# Patient Record
Sex: Female | Born: 1945 | Race: Black or African American | Hispanic: No | Smoking: Former smoker
Health system: Southern US, Community
[De-identification: ages and names within clinical notes are randomized; demographics above are authoritative.]

## PROBLEM LIST (undated history)

## (undated) DIAGNOSIS — E7800 Pure hypercholesterolemia, unspecified: Secondary | ICD-10-CM

## (undated) DIAGNOSIS — K635 Polyp of colon: Secondary | ICD-10-CM

## (undated) DIAGNOSIS — I509 Heart failure, unspecified: Secondary | ICD-10-CM

## (undated) DIAGNOSIS — E119 Type 2 diabetes mellitus without complications: Secondary | ICD-10-CM

## (undated) DIAGNOSIS — I429 Cardiomyopathy, unspecified: Secondary | ICD-10-CM

## (undated) DIAGNOSIS — K5790 Diverticulosis of intestine, part unspecified, without perforation or abscess without bleeding: Secondary | ICD-10-CM

## (undated) DIAGNOSIS — I10 Essential (primary) hypertension: Secondary | ICD-10-CM

## (undated) DIAGNOSIS — I2510 Atherosclerotic heart disease of native coronary artery without angina pectoris: Secondary | ICD-10-CM

## (undated) HISTORY — PX: ABDOMINAL HYSTERECTOMY: SHX81

## (undated) HISTORY — PX: COLONOSCOPY: SHX174

---

## 2011-01-23 ENCOUNTER — Ambulatory Visit: Payer: Self-pay | Admitting: Internal Medicine

## 2011-03-06 ENCOUNTER — Ambulatory Visit: Payer: Self-pay | Admitting: Internal Medicine

## 2012-03-19 ENCOUNTER — Ambulatory Visit: Payer: Self-pay

## 2012-04-24 ENCOUNTER — Ambulatory Visit: Payer: Self-pay

## 2012-05-21 ENCOUNTER — Ambulatory Visit: Payer: Self-pay

## 2012-07-10 ENCOUNTER — Ambulatory Visit: Payer: Self-pay

## 2013-03-23 ENCOUNTER — Ambulatory Visit: Payer: Self-pay

## 2013-03-27 ENCOUNTER — Ambulatory Visit: Payer: Self-pay

## 2013-08-25 ENCOUNTER — Inpatient Hospital Stay: Payer: Self-pay | Admitting: Internal Medicine

## 2013-08-25 LAB — CBC
HCT: 47.9 % — AB (ref 35.0–47.0)
HGB: 15.6 g/dL (ref 12.0–16.0)
MCH: 27.8 pg (ref 26.0–34.0)
MCHC: 32.5 g/dL (ref 32.0–36.0)
MCV: 86 fL (ref 80–100)
PLATELETS: 280 10*3/uL (ref 150–440)
RBC: 5.6 10*6/uL — AB (ref 3.80–5.20)
RDW: 15.3 % — AB (ref 11.5–14.5)
WBC: 6.9 10*3/uL (ref 3.6–11.0)

## 2013-08-25 LAB — TROPONIN I
TROPONIN-I: 0.07 ng/mL — AB
Troponin-I: <0.02 ng/mL
Troponin-I: 0.05 ng/mL

## 2013-08-25 LAB — COMPREHENSIVE METABOLIC PANEL
ALBUMIN: 3.7 g/dL (ref 3.4–5.0)
AST: 14 U/L — AB (ref 15–37)
Alkaline Phosphatase: 91 U/L
Anion Gap: 6 — ABNORMAL LOW (ref 7–16)
BUN: 13 mg/dL (ref 7–18)
Bilirubin,Total: 0.4 mg/dL (ref 0.2–1.0)
Calcium, Total: 9.3 mg/dL (ref 8.5–10.1)
Chloride: 105 mmol/L (ref 98–107)
Co2: 28 mmol/L (ref 21–32)
Creatinine: 1.27 mg/dL (ref 0.60–1.30)
EGFR (African American): 50 — ABNORMAL LOW
GFR CALC NON AF AMER: 43 — AB
Glucose: 169 mg/dL — ABNORMAL HIGH (ref 65–99)
OSMOLALITY: 282 (ref 275–301)
POTASSIUM: 4.1 mmol/L (ref 3.5–5.1)
SGPT (ALT): 25 U/L (ref 12–78)
SODIUM: 139 mmol/L (ref 136–145)
Total Protein: 8 g/dL (ref 6.4–8.2)

## 2013-08-25 LAB — PRO B NATRIURETIC PEPTIDE: B-Type Natriuretic Peptide: 967 pg/mL — ABNORMAL HIGH (ref 0–125)

## 2013-08-26 LAB — BASIC METABOLIC PANEL
ANION GAP: 5 — AB (ref 7–16)
BUN: 18 mg/dL (ref 7–18)
CREATININE: 1.5 mg/dL — AB (ref 0.60–1.30)
Calcium, Total: 8.8 mg/dL (ref 8.5–10.1)
Chloride: 108 mmol/L — ABNORMAL HIGH (ref 98–107)
Co2: 29 mmol/L (ref 21–32)
EGFR (African American): 41 — ABNORMAL LOW
EGFR (Non-African Amer.): 35 — ABNORMAL LOW
Glucose: 114 mg/dL — ABNORMAL HIGH (ref 65–99)
OSMOLALITY: 286 (ref 275–301)
Potassium: 3.7 mmol/L (ref 3.5–5.1)
Sodium: 142 mmol/L (ref 136–145)

## 2013-08-26 LAB — CBC WITH DIFFERENTIAL/PLATELET
Basophil #: 0 10*3/uL (ref 0.0–0.1)
Basophil %: 0.5 %
Eosinophil #: 0.1 10*3/uL (ref 0.0–0.7)
Eosinophil %: 0.7 %
HCT: 40 % (ref 35.0–47.0)
HGB: 13.3 g/dL (ref 12.0–16.0)
LYMPHS ABS: 2.2 10*3/uL (ref 1.0–3.6)
Lymphocyte %: 27.2 %
MCH: 28.4 pg (ref 26.0–34.0)
MCHC: 33.2 g/dL (ref 32.0–36.0)
MCV: 85 fL (ref 80–100)
MONO ABS: 0.6 x10 3/mm (ref 0.2–0.9)
Monocyte %: 7.1 %
NEUTROS ABS: 5.2 10*3/uL (ref 1.4–6.5)
NEUTROS PCT: 64.5 %
Platelet: 244 10*3/uL (ref 150–440)
RBC: 4.68 10*6/uL (ref 3.80–5.20)
RDW: 14.9 % — ABNORMAL HIGH (ref 11.5–14.5)
WBC: 8 10*3/uL (ref 3.6–11.0)

## 2013-08-26 LAB — PROTIME-INR
INR: 1.1
Prothrombin Time: 14 secs (ref 11.5–14.7)

## 2013-08-27 LAB — CBC WITH DIFFERENTIAL/PLATELET
BASOS ABS: 0 10*3/uL (ref 0.0–0.1)
BASOS PCT: 0.5 %
Eosinophil #: 0.1 10*3/uL (ref 0.0–0.7)
Eosinophil %: 1.5 %
HCT: 38.4 % (ref 35.0–47.0)
HGB: 12.6 g/dL (ref 12.0–16.0)
LYMPHS PCT: 35 %
Lymphocyte #: 2.2 10*3/uL (ref 1.0–3.6)
MCH: 27.9 pg (ref 26.0–34.0)
MCHC: 32.8 g/dL (ref 32.0–36.0)
MCV: 85 fL (ref 80–100)
MONO ABS: 0.5 x10 3/mm (ref 0.2–0.9)
Monocyte %: 8 %
Neutrophil #: 3.4 10*3/uL (ref 1.4–6.5)
Neutrophil %: 55 %
Platelet: 228 10*3/uL (ref 150–440)
RBC: 4.52 10*6/uL (ref 3.80–5.20)
RDW: 15.2 % — ABNORMAL HIGH (ref 11.5–14.5)
WBC: 6.2 10*3/uL (ref 3.6–11.0)

## 2013-08-27 LAB — COMPREHENSIVE METABOLIC PANEL
ALBUMIN: 3 g/dL — AB (ref 3.4–5.0)
Alkaline Phosphatase: 67 U/L
Anion Gap: 6 — ABNORMAL LOW (ref 7–16)
BUN: 18 mg/dL (ref 7–18)
Bilirubin,Total: 0.6 mg/dL (ref 0.2–1.0)
CHLORIDE: 107 mmol/L (ref 98–107)
CREATININE: 1.55 mg/dL — AB (ref 0.60–1.30)
Calcium, Total: 8.9 mg/dL (ref 8.5–10.1)
Co2: 28 mmol/L (ref 21–32)
EGFR (Non-African Amer.): 34 — ABNORMAL LOW
GFR CALC AF AMER: 39 — AB
GLUCOSE: 147 mg/dL — AB (ref 65–99)
Osmolality: 286 (ref 275–301)
Potassium: 3.6 mmol/L (ref 3.5–5.1)
SGOT(AST): 16 U/L (ref 15–37)
SGPT (ALT): 15 U/L (ref 12–78)
SODIUM: 141 mmol/L (ref 136–145)
Total Protein: 6.3 g/dL — ABNORMAL LOW (ref 6.4–8.2)

## 2013-08-28 LAB — CBC WITH DIFFERENTIAL/PLATELET
Basophil #: 0 10*3/uL (ref 0.0–0.1)
Basophil %: 0.2 %
Eosinophil #: 0.1 10*3/uL (ref 0.0–0.7)
Eosinophil %: 2.3 %
HCT: 37.7 % (ref 35.0–47.0)
HGB: 12.2 g/dL (ref 12.0–16.0)
Lymphocyte #: 2.3 10*3/uL (ref 1.0–3.6)
Lymphocyte %: 42.2 %
MCH: 27.5 pg (ref 26.0–34.0)
MCHC: 32.4 g/dL (ref 32.0–36.0)
MCV: 85 fL (ref 80–100)
MONO ABS: 0.5 x10 3/mm (ref 0.2–0.9)
Monocyte %: 8.7 %
NEUTROS PCT: 46.6 %
Neutrophil #: 2.5 10*3/uL (ref 1.4–6.5)
PLATELETS: 219 10*3/uL (ref 150–440)
RBC: 4.44 10*6/uL (ref 3.80–5.20)
RDW: 14.7 % — ABNORMAL HIGH (ref 11.5–14.5)
WBC: 5.4 10*3/uL (ref 3.6–11.0)

## 2013-08-28 LAB — BASIC METABOLIC PANEL
Anion Gap: 5 — ABNORMAL LOW (ref 7–16)
BUN: 17 mg/dL (ref 7–18)
Calcium, Total: 8.5 mg/dL (ref 8.5–10.1)
Chloride: 106 mmol/L (ref 98–107)
Co2: 30 mmol/L (ref 21–32)
Creatinine: 1.26 mg/dL (ref 0.60–1.30)
EGFR (African American): 50 — ABNORMAL LOW
EGFR (Non-African Amer.): 43 — ABNORMAL LOW
Glucose: 140 mg/dL — ABNORMAL HIGH (ref 65–99)
OSMOLALITY: 285 (ref 275–301)
Potassium: 3.5 mmol/L (ref 3.5–5.1)
Sodium: 141 mmol/L (ref 136–145)

## 2013-08-30 LAB — CULTURE, BLOOD (SINGLE)

## 2013-09-29 ENCOUNTER — Ambulatory Visit: Payer: Self-pay | Admitting: Internal Medicine

## 2014-03-24 ENCOUNTER — Ambulatory Visit: Payer: Self-pay | Admitting: Internal Medicine

## 2014-09-11 NOTE — Consult Note (Signed)
   Present Illness 69 yo female with recent diagnosis of cardiomyopathy with echo revealing ef of 20-30%. She was evaluated with a funcitonal study last week which revealed reduced lv funciton and probable ischemia. She presented to the er with complaints of sob. She was noted to be in chf by cxr. She was treated with iv lasix with improvement. She has ruled out for an mi thus far. Etiology of cardiomyopathy is unclear.   Physical Exam:  GEN no acute distress   HEENT PERRL, hearing intact to voice   NECK supple   RESP normal resp effort  rhonchi  crackles   CARD Regular rate and rhythm  Normal, S1, S2  Murmur   Murmur Systolic   Systolic Murmur axilla   ABD denies tenderness  normal BS   EXTR positive edema   SKIN normal to palpation   NEURO cranial nerves intact, motor/sensory function intact   PSYCH A+O to time, place, person   Review of Systems:  Subjective/Chief Complaint shortness of breath   General: Fatigue  Weakness   Skin: No Complaints   ENT: No Complaints   Eyes: No Complaints   Neck: No Complaints   Respiratory: Short of breath   Cardiovascular: Tightness  Dyspnea   Gastrointestinal: No Complaints   Genitourinary: No Complaints   Vascular: No Complaints   Musculoskeletal: No Complaints   Neurologic: No Complaints   Hematologic: No Complaints   Endocrine: No Complaints   Psychiatric: No Complaints   Review of Systems: All other systems were reviewed and found to be negative   Medications/Allergies Reviewed Medications/Allergies reviewed   EKG:  EKG NSR   Abnormal NSSTTW changes   Radiology Results: XRay:    07-Apr-15 11:54, Chest Portable Single View  Chest Portable Single View   REASON FOR EXAM:    Shortness of Breath  COMMENTS:       PROCEDURE: DXR - DXR PORTABLE CHEST SINGLE VIEW  - Aug 25 2013 11:54AM     CLINICAL DATA:  Respiratory distress    EXAM:  PORTABLE CHEST - 1 VIEW    COMPARISON:   None.    FINDINGS:  Symmetric bilateral airspace disease, most consistent with pulmonary  edema although infection not excluded. No significant effusion.  Heart size upper normal.     IMPRESSION:  Diffuse bilateral airspace disease, most consistent with acute  pulmonary edema.      Electronically Signed    By: Franchot Gallo M.D.    On: 08/25/2013 12:00         Verified By: Truett Perna, M.D.,    No Known Allergies:    Impression Pt with history of recent diagnosis of cardiomyopahty with ef of 20-30% with recent funcitonal study suggesting global ischemia. Presented wiht progressive shortness of breath and noted to have congestive heart failure on cxr. Appears to have acute on chronic chf with NYHA Class IV chf. Imporved wiht iv lasix. Has rule dout for mi thus far. Will need right and left heart cath to evaluate filling pressures and for evidence of cad.   Plan 1. Continue to diurese 2. Rule out for mi 3. COnsider chaging to carvedilol after cath completed. 4. Right and Left heart cath in am.   Electronic Signatures: Teodoro Spray (MD)  (Signed 07-Apr-15 19:19)  Authored: General Aspect/Present Illness, History and Physical Exam, Review of System, EKG , Radiology, Allergies, Impression/Plan   Last Updated: 07-Apr-15 19:19 by Teodoro Spray (MD)

## 2014-09-11 NOTE — Discharge Summary (Signed)
Dates of Admission and Diagnosis:  Date of Admission 25-Aug-2013   Date of Discharge 28-Aug-2013   Admitting Diagnosis Acute pulmonary edema   Final Diagnosis Acute systolic heart failure, Contrast nephropathy, Diabetes, Hyperlipidemia   Discharge Diagnosis 1 Acute systolic heart failure   2 Idiopathic cardiomyopathy   3 Contrast nephropathy   4 Diabetes   5 Hyperlipidemia    Chief Complaint/History of Present Illness 69 year old AA lady presented to the ED with acute shortness of breath. CXR in ED was consistent with acute pulmonary edema. Recent stress test as out patient was positive. ECHO was remarkable for EF=20-30%   Allergies:  No Known Allergies:   Routine Chem:  07-Apr-15 12:12   Creatinine (comp) 1.27  08-Apr-15 04:30   Creatinine (comp)  1.50  09-Apr-15 04:42   Creatinine (comp)  1.55  10-Apr-15 04:37   Glucose, Serum  140  BUN 17  Creatinine (comp) 1.26  Sodium, Serum 141  Potassium, Serum 3.5  Chloride, Serum 106  CO2, Serum 30  Calcium (Total), Serum 8.5  Anion Gap  5  Osmolality (calc) 285  eGFR (African American)  50  eGFR (Non-African American)  43 (eGFR values <61mL/min/1.73 m2 may be an indication of chronic kidney disease (CKD). Calculated eGFR is useful in patients with stable renal function. The eGFR calculation will not be reliable in acutely ill patients when serum creatinine is changing rapidly. It is not useful in  patients on dialysis. The eGFR calculation may not be applicable to patients at the low and high extremes of body sizes, pregnant women, and vegetarians.)  Routine Hem:  10-Apr-15 04:37   WBC (CBC) 5.4  RBC (CBC) 4.44  Hemoglobin (CBC) 12.2  Hematocrit (CBC) 37.7  Platelet Count (CBC) 219  MCV 85  MCH 27.5  MCHC 32.4  RDW  14.7  Neutrophil % 46.6  Lymphocyte % 42.2  Monocyte % 8.7  Eosinophil % 2.3  Basophil % 0.2  Neutrophil # 2.5  Lymphocyte # 2.3  Monocyte # 0.5  Eosinophil # 0.1  Basophil # 0.0  (Result(s) reported on 28 Aug 2013 at 05:03AM.)   PERTINENT RADIOLOGY STUDIES: XRay:    07-Apr-15 11:54, Chest Portable Single View  Chest Portable Single View   REASON FOR EXAM:    Shortness of Breath  COMMENTS:       PROCEDURE: DXR - DXR PORTABLE CHEST SINGLE VIEW  - Aug 25 2013 11:54AM     CLINICAL DATA:  Respiratory distress    EXAM:  PORTABLE CHEST - 1 VIEW    COMPARISON:  None.    FINDINGS:  Symmetric bilateral airspace disease, most consistent with pulmonary  edema although infection not excluded. No significant effusion.  Heart size upper normal.     IMPRESSION:  Diffuse bilateral airspace disease, most consistent with acute  pulmonary edema.      Electronically Signed    By: Franchot Gallo M.D.    On: 08/25/2013 12:00         Verified By: Truett Perna, M.D.,   Pertinent Past History:  Pertinent Past History Diabetes Hyperlipidemia   Hospital Course:  Hospital Course Patient was admitted for acute systolic heart failure. Cardiac cath done the next day, revealed normal coronary arteries and EF=20-25%. This was felt to be consistent with c/w idiopathic cardiomyopathy. Patient was treated with Lasix,  ACEI, Metoprolol was switched to Carvedilol 6.25 mg bid. She was on a low sodium diet. Daily I/O and monitoring of electrolytes. The creatinine increased from  1.27 on admission to 1.55 after cardiac cath, suggestive of contrast nephropathy. Patient was hydrated with 1/2 NS and creatinine improved to 1.26 prior to discharge.  DM remained well controlled. Statin was continued for hyperlipidemia. Exam: Alert, oriented, NAD. HENT: No JVD, Chest: clear, CVS: RRR, S1S2, Abdomen: soft, nontender. Ext: no ankle edema. Neuro: nonfocal. Plan is to discharge home.   Condition on Discharge Stable   DISCHARGE INSTRUCTIONS HOME MEDS:  Medication Reconciliation: Patient's Home Medications at Discharge:     Medication Instructions  metformin 500 mg oral tablet  2 tab(s)  orally 2 times a day   simvastatin 40 mg oral tablet  1 tab(s) orally once a day (at bedtime)   januvia 100 mg oral tablet  1 tab(s) orally once a day   aspirin low dose 81 mg oral delayed release tablet  1 tab(s) orally once a day   omeprazole 20 mg oral delayed release capsule  1 cap(s) orally once a day   vitamin d3  1 tab(s) orally once a day   enalapril 10 mg oral tablet  1 tab(s) orally once a day   carvedilol 6.25 mg oral tablet  1 tab(s) orally 2 times a day   furosemide 40 mg oral tablet  1 tab(s) orally once a day    PRESCRIPTIONS: PRINTED AND GIVEN TO PATIENT/FAMILY  STOP TAKING THE FOLLOWING MEDICATION(S):    metoprolol succinate er 25 mg oral tablet, extended release: 1 tab(s) orally once a day  Physician's Instructions:  Diet Low Sodium  Low Fat, Low Cholesterol  Carbohydrate Controlled (ADA) Diet   Activity Limitations As tolerated   Return to Work Not Applicable   Time frame for Follow Up Appointment 1-2 weeks  Dr. Glendon Axe   Time frame for Follow Up Appointment 1-2 weeks  [facility] cardiology   Electronic Signatures: Glendon Axe (MD)  (Signed 10-Apr-15 13:15)  Authored: ADMISSION DATE AND DIAGNOSIS, CHIEF COMPLAINT/HPI, Allergies, PERTINENT LABS, PERTINENT RADIOLOGY STUDIES, PERTINENT PAST HISTORY, HOSPITAL COURSE, DISCHARGE INSTRUCTIONS HOME MEDS, PATIENT INSTRUCTIONS   Last Updated: 10-Apr-15 13:15 by Glendon Axe (MD)

## 2014-09-11 NOTE — H&P (Signed)
PATIENT NAME:  Linda Montgomery, Linda Montgomery MR#:  [id number] DATE OF BIRTH:  [birth year]  DATE OF ADMISSION:  08/25/2013  PRIMARY CARE PHYSICIAN: Glendon Axe, MD'  CARDIOLOGIST:  Javier Docker. Ubaldo Glassing, MD  CHIEF COMPLAINT: Shortness of breath.   HISTORY OF PRESENT ILLNESS: This is a 69 year old female who presents to the Emergency Room from a dentist's office due to shortness of breath. The patient went to get her dentures taken care of and she became short of breath and was sent over here. The patient was noted to have acute pulmonary edema on the chest x-ray. She received a dose of IV Lasix and was also started on BiPAP. After receiving some Lasix and being on BiPAP for a little while, her symptoms have significantly improved. The patient says that she has been feeling short of breath now for a few weeks. She actually saw a cardiologist as an outpatient, underwent echocardiogram and a stress test and was awaiting the results of them. The patient denies any chest pain, any nausea, vomiting, abdominal pain, fevers, chills, cough. Denies any paroxysmal nocturnal dyspnea, orthopnea or any lower extremity edema. The patient presented to the Emergency Room, noted to be in acute CHF with pulmonary edema and hospitalist services were contacted for further treatment and evaluation.   REVIEW OF SYSTEMS: CONSTITUTIONAL: No documented fever. No weight gain or weight loss.  EYES: No blurred or double vision.  ENT: No tinnitus. No postnasal drip. No redness of the oropharynx.  RESPIRATORY: Positive cough. Positive wheeze. No hemoptysis. Positive dyspnea.  CARDIOVASCULAR: No chest pain, no orthopnea, no palpitations, no syncope.  GASTROINTESTINAL: No nausea, vomiting, diarrhea. No abdominal pain. No melena or hematochezia.  GENITOURINARY: No dysuria and hematuria.  ENDOCRINE: No polyuria or nocturia. No heat or cold intolerance.  HEMATOLOGIC: No anemia, no bruising, no bleeding.  INTEGUMENTARY: No rashes or lesions.   MUSCULOSKELETAL: No arthritis. No swelling. No gout.  NEUROLOGIC: No numbness or tingling. No ataxia. No seizure-type activity.  PSYCHIATRIC:  No anxiety, no insomnia. No ADD.   PAST MEDICAL HISTORY: Consistent with diabetes, hypertension, hyperlipidemia.   ALLERGIES: No known drug allergies.   SOCIAL HISTORY: No smoking. Occasional alcohol use. No illicit drug abuse. Lives at home with her husband.   FAMILY HISTORY: Mother and father are both deceased. Mother died from liver cancer. Father died from cirrhosis of liver.   CURRENT MEDICATIONS: Aspirin 81 mg daily, enalapril 10 mg daily, Januvia 100 mg daily, metformin 1000 mg b.i.d., Toprol 25 mg daily, omeprazole 20 mg daily, simvastatin 40 mg at bedtime, vitamin D3 daily.   PHYSICAL EXAMINATION: Presently is as follows:  VITAL SIGNS: Temperature is 97.5, pulse 88, respirations 20, blood pressure 123/78, stats 99% on 4 liters nasal cannula.  GENERAL: She is a pleasant-appearing female in mild respiratory distress.  HEAD, EYES, EARS, NOSE AND THROAT: Atraumatic, normocephalic. Extraocular muscles are intact. Pupils equal and reactive to light. Sclerae anicteric. No conjunctival injection. No pharyngeal erythema.  NECK: Supple. There is no bruits. No lymphadenopathy. She has got positive JVD.  HEART: Regular rate and rhythm. No murmurs. No rubs. No clicks.  LUNGS: She has some coarse crackles midway up the lung fields bilaterally. Negative use of accessory muscles, dullness to percussion.  ABDOMEN: Soft, flat, nontender, nondistended. Has good bowel sounds. No hepatosplenomegaly appreciated.  EXTREMITIES: No evidence of any cyanosis, clubbing or peripheral edema. Has positive pedal and radial pulses bilaterally.  NEUROLOGICAL: The patient is alert, awake and oriented x 3 with no focal motor or sensory deficits  appreciated bilaterally.  SKIN: Moist and warm with no rashes appreciated.  LYMPHATIC: There is no cervical or axillary  lymphadenopathy.   LABORATORY, DIAGNOSTIC AND RADIOLOGICAL DATA:  Serum glucose 169, BUN 13, creatinine 1.2, sodium 139, potassium 4.1, chloride 105, bicarb 28. LFTs were within normal limits. Troponin less than 0.02. White cell count 6.9, hemoglobin 15.6, hematocrit 47.9, platelet count 280. ABG showed a pH of 7.32, pCO2 43, pO2 of 206 on BiPAP. The patient's chest x-ray showed interstitial prominence consistent with pulmonary edema.   ASSESSMENT AND PLAN: This is a 69 year old female with a history of diabetes, hypertension, hyperlipidemia who presents to the hospital due to shortness of breath while in the dentist's office. Noted to be in acute congestive heart failure.  1.  Acute congestive heart failure. This is likely the cause of the patient's shortness of breath and likely acute systolic congestive heart failure. The patient had an echocardiogram done at the [facility] office which showed an ejection fraction of 30% about a couple of weeks back. She also had a stress test done which showed ischemic cardiomyopathy and was going to follow up with her cardiologist coming up in the next week. At this point, I will diurese her with IV Lasix, follow ins and outs and daily weights. Continue her Toprol and ACE inhibitor. I would consider switching her to Coreg given her poor ejection fraction but will await further cardiology input at this time.  2.  Ischemic cardiomyopathy, ejection fraction of 30%. The patient just had an abnormal stress test done and needs a cardiac catheterization. I have notified Dr. Ubaldo Glassing, who will likely do that in the next day or so. For now, we will continue aspirin, statin and beta blocker. She is currently chest pain-free.  3.  Diabetes. I will hold her metformin and Januvia, place her on sliding scale insulin. Follow blood sugars.  4.  Hyperlipidemia. Continue simvastatin.  5.  Gastroesophageal reflux disease. Continue ranitidine. 6.  CODE STATUS: The patient is a full  code.   TIME SPENT: 50 minutes.   ____________________________ Belia Heman. Verdell Carmine, MD vjs:cs D: 08/25/2013 14:33:11 ET T: 08/25/2013 15:00:27 ET JOB#: [id number]  cc: Belia Heman. Verdell Carmine, MD, <Dictator> Henreitta Leber MD ELECTRONICALLY SIGNED 08/30/2013 18:49

## 2014-09-20 ENCOUNTER — Emergency Department: Payer: Medicare Other

## 2014-09-20 ENCOUNTER — Emergency Department
Admission: EM | Admit: 2014-09-20 | Discharge: 2014-09-20 | Disposition: A | Discharge status: Home / Self Care | Payer: Medicare Other | Attending: Emergency Medicine | Admitting: Emergency Medicine

## 2014-09-20 ENCOUNTER — Encounter: Payer: Self-pay | Admitting: *Deleted

## 2014-09-20 DIAGNOSIS — J40 Bronchitis, not specified as acute or chronic: Secondary | ICD-10-CM

## 2014-09-20 DIAGNOSIS — E119 Type 2 diabetes mellitus without complications: Secondary | ICD-10-CM | POA: Diagnosis not present

## 2014-09-20 DIAGNOSIS — I10 Essential (primary) hypertension: Secondary | ICD-10-CM | POA: Diagnosis not present

## 2014-09-20 DIAGNOSIS — J209 Acute bronchitis, unspecified: Secondary | ICD-10-CM | POA: Insufficient documentation

## 2014-09-20 DIAGNOSIS — R42 Dizziness and giddiness: Secondary | ICD-10-CM | POA: Insufficient documentation

## 2014-09-20 DIAGNOSIS — R05 Cough: Secondary | ICD-10-CM | POA: Diagnosis present

## 2014-09-20 HISTORY — DX: Type 2 diabetes mellitus without complications: E11.9

## 2014-09-20 HISTORY — DX: Pure hypercholesterolemia, unspecified: E78.00

## 2014-09-20 HISTORY — DX: Essential (primary) hypertension: I10

## 2014-09-20 MED ORDER — AZITHROMYCIN 250 MG PO TABS
ORAL_TABLET | ORAL | Status: AC
  Administered 2014-09-20: 250 mg
  Filled 2014-09-20: qty 2

## 2014-09-20 MED ORDER — AZITHROMYCIN 250 MG PO TABS
500.0000 mg | ORAL_TABLET | Freq: Once | ORAL | Status: AC
  Administered 2014-09-20: 500 mg via ORAL

## 2014-09-20 MED ORDER — HYDROCOD POLST-CPM POLST ER 10-8 MG/5ML PO SUER
ORAL | Status: AC
  Administered 2014-09-20: 22:00:00
  Filled 2014-09-20: qty 5

## 2014-09-20 MED ORDER — HYDROCOD POLST-CPM POLST ER 10-8 MG/5ML PO SUER
5.0000 mL | Freq: Once | ORAL | Status: AC
  Administered 2014-09-20: 5 mL via ORAL

## 2014-09-20 MED ORDER — AZITHROMYCIN 250 MG PO TABS: ORAL_TABLET | ORAL | Status: AC

## 2014-09-20 MED ORDER — HYDROCOD POLST-CPM POLST ER 10-8 MG/5ML PO SUER: 5.0000 mL | Freq: Two times a day (BID) | ORAL | Status: DC

## 2014-09-20 NOTE — ED Notes (Signed)
Patient with no complaints at this time. Respirations even and unlabored. Skin warm/dry. Discharge instructions reviewed with patient at this time. Patient given opportunity to voice concerns/ask questions. Patient discharged at this time and left Emergency Department with steady gait.   

## 2014-09-20 NOTE — Discharge Instructions (Signed)

## 2014-09-20 NOTE — ED Provider Notes (Signed)
[facility] Emergency Department Provider Note   Time seen: 10 PM  I have reviewed the triage vital signs and the nursing notes.   HISTORY  Chief Complaint Cough    HPI Linda Montgomery is a 69 y.o. female who presents to ER with 3 weeks of cough and sputum production. Patient states it's worse when she lays down at night. Does not describe any weight gain or fluid retention. She coughs so much sometimes she gets dizzy. Denies chest pain shortness of breath fevers chills or other complaints. Does not localize any pain.    Past Medical History  Diagnosis Date  . Diabetes mellitus without complication   . Hypertension   . Hypercholesterolemia     There are no active problems to display for this patient.   No past surgical history on file.  No current outpatient prescriptions on file.  Allergies Review of patient's allergies indicates no known allergies.  No family history on file.  Social History History  Substance Use Topics  . Smoking status: Never Smoker   . Smokeless tobacco: Not on file  . Alcohol Use: No    Review of Systems Constitutional: Negative for fever. Eyes: Negative for visual changes. ENT: Negative for sore throat. Cardiovascular: Negative for chest pain. Respiratory: Negative for shortness of breath. Gastrointestinal: Negative for abdominal pain, vomiting and diarrhea. Genitourinary: Negative for dysuria. Musculoskeletal: Negative for back pain. Skin: Negative for rash. Neurological: Negative for headaches, focal weakness or numbness.  10-point ROS otherwise negative.  ____________________________________________   PHYSICAL EXAM:  VITAL SIGNS: ED Triage Vitals  Enc Vitals Group     BP 09/20/14 2057 175/81 mmHg     Pulse Rate 09/20/14 2057 99     Resp 09/20/14 2057 17     Temp 09/20/14 2057 98.9 F (37.2 C)     Temp Source 09/20/14 2057 Oral     SpO2 09/20/14 2057 98 %     Weight 09/20/14 2057 224 lb  (101.606 kg)     Height 09/20/14 2057 5\' 4"  (1.626 m)     Head Cir --      Peak Flow --      Pain Score --      Pain Loc --      Pain Edu? --      Excl. in [place]? --     Constitutional: Alert and oriented. Well appearing and in no distress. Eyes: Conjunctivae are normal. PERRL. Normal extraocular movements. ENT   Head: Normocephalic and atraumatic.   Nose: No congestion/rhinnorhea.   Mouth/Throat: Mucous membranes are moist.   Neck: No stridor. Hematological/Lymphatic/Immunilogical: No cervical lymphadenopathy. Cardiovascular: Normal rate, regular rhythm. Normal and symmetric distal pulses are present in all extremities. No murmurs, rubs, or gallops. Respiratory: Normal respiratory effort without tachypnea nor retractions. Breath sounds are clear and equal bilaterally. No wheezes/rales/rhonchi. Gastrointestinal: Soft and nontender. No distention. No abdominal bruits. There is no CVA tenderness.  Musculoskeletal: Nontender with normal range of motion in all extremities. No joint effusions.  No lower extremity tenderness nor edema. Neurologic:  Normal speech and language. No gross focal neurologic deficits are appreciated. Speech is normal. No gait instability. Skin:  Skin is warm, dry and intact. No rash noted. Psychiatric: Mood and affect are normal. Speech and behavior are normal. Patient exhibits appropriate insight and judgment.  ____________________________________________    LABS (pertinent positives/negatives)  None  ____________________________________________       RADIOLOGY  Normal chest x-ray  ____________________________________________    IMPRESSION /  ASSESSMENT AND PLAN / ED COURSE  Pertinent labs & imaging results that were available during my care of the patient were reviewed by me and considered in my medical decision making (see chart for details).  Bronchitis  Patient presents here with 3 weeks of productive cough. Will place on  Zithromax and Tussionex with close follow-up with her doctor. No signs of any congestive heart failure.   Earleen Newport, MD   Earleen Newport, MD 09/20/14 [phone]

## 2014-09-20 NOTE — ED Notes (Signed)
Pt states she developed this cough 3 weeks ago. Pt states cough is productive, pt feels cough is getting worse. Pt states she coughs so much she gets dizzy

## 2015-04-29 ENCOUNTER — Other Ambulatory Visit: Payer: Self-pay | Admitting: Internal Medicine

## 2015-04-29 DIAGNOSIS — Z1231 Encounter for screening mammogram for malignant neoplasm of breast: Secondary | ICD-10-CM

## 2015-05-06 ENCOUNTER — Ambulatory Visit
Admission: RE | Admit: 2015-05-06 | Discharge: 2015-05-06 | Disposition: A | Discharge status: Home / Self Care | Payer: Medicare Other | Source: Ambulatory Visit | Attending: Internal Medicine | Admitting: Internal Medicine

## 2015-05-06 ENCOUNTER — Other Ambulatory Visit: Payer: Self-pay | Admitting: Internal Medicine

## 2015-05-06 DIAGNOSIS — Z1231 Encounter for screening mammogram for malignant neoplasm of breast: Secondary | ICD-10-CM

## 2015-08-02 ENCOUNTER — Emergency Department
Admission: EM | Admit: 2015-08-02 | Discharge: 2015-08-02 | Disposition: A | Discharge status: Home / Self Care | Payer: Medicare Other | Attending: Emergency Medicine | Admitting: Emergency Medicine

## 2015-08-02 ENCOUNTER — Emergency Department: Payer: Medicare Other

## 2015-08-02 ENCOUNTER — Encounter: Payer: Self-pay | Admitting: Emergency Medicine

## 2015-08-02 DIAGNOSIS — R0600 Dyspnea, unspecified: Secondary | ICD-10-CM

## 2015-08-02 DIAGNOSIS — E119 Type 2 diabetes mellitus without complications: Secondary | ICD-10-CM | POA: Diagnosis not present

## 2015-08-02 DIAGNOSIS — Z79899 Other long term (current) drug therapy: Secondary | ICD-10-CM | POA: Diagnosis not present

## 2015-08-02 DIAGNOSIS — Z87891 Personal history of nicotine dependence: Secondary | ICD-10-CM | POA: Diagnosis not present

## 2015-08-02 DIAGNOSIS — I10 Essential (primary) hypertension: Secondary | ICD-10-CM | POA: Insufficient documentation

## 2015-08-02 DIAGNOSIS — R0602 Shortness of breath: Secondary | ICD-10-CM | POA: Diagnosis present

## 2015-08-02 DIAGNOSIS — I509 Heart failure, unspecified: Secondary | ICD-10-CM | POA: Insufficient documentation

## 2015-08-02 LAB — BASIC METABOLIC PANEL
Anion gap: 9 (ref 5–15)
BUN: 15 mg/dL (ref 6–20)
CO2: 23 mmol/L (ref 22–32)
CREATININE: 0.87 mg/dL (ref 0.44–1.00)
Calcium: 10 mg/dL (ref 8.9–10.3)
Chloride: 108 mmol/L (ref 101–111)
Glucose, Bld: 79 mg/dL (ref 65–99)
Potassium: 3.9 mmol/L (ref 3.5–5.1)
SODIUM: 140 mmol/L (ref 135–145)

## 2015-08-02 LAB — CBC
HCT: 41.8 % (ref 35.0–47.0)
Hemoglobin: 13.7 g/dL (ref 12.0–16.0)
MCH: 27.3 pg (ref 26.0–34.0)
MCHC: 32.7 g/dL (ref 32.0–36.0)
MCV: 83.4 fL (ref 80.0–100.0)
PLATELETS: 306 10*3/uL (ref 150–440)
RBC: 5.02 MIL/uL (ref 3.80–5.20)
RDW: 15.4 % — AB (ref 11.5–14.5)
WBC: 6.3 10*3/uL (ref 3.6–11.0)

## 2015-08-02 LAB — TROPONIN I: Troponin I: <0.03 ng/mL (ref <0.031)

## 2015-08-02 MED ORDER — FUROSEMIDE 20 MG PO TABS: 20.0000 mg | ORAL_TABLET | Freq: Every day | ORAL | Status: DC

## 2015-08-02 NOTE — Discharge Instructions (Signed)
Please take your medications as prescribed. Please follow-up with her cardiologist but calling the number provided to arrange a follow-up appointment as soon as possible. Return to the emergency department for any worsening trouble breathing, or if you develop any chest pain.   Shortness of Breath Shortness of breath means you have trouble breathing. Shortness of breath needs medical care right away. HOME CARE   Do not smoke.  Avoid being around chemicals or things (paint fumes, dust) that may bother your breathing.  Rest as needed. Slowly begin your normal activities.  Only take medicines as told by your doctor.  Keep all doctor visits as told. GET HELP RIGHT AWAY IF:   Your shortness of breath gets worse.  You feel lightheaded, pass out (faint), or have a cough that is not helped by medicine.  You cough up blood.  You have pain with breathing.  You have pain in your chest, arms, shoulders, or belly (abdomen).  You have a fever.  You cannot walk up stairs or exercise the way you normally do.  You do not get better in the time expected.  You have a hard time doing normal activities even with rest.  You have problems with your medicines.  You have any new symptoms. MAKE SURE YOU:  Understand these instructions.  Will watch your condition.  Will get help right away if you are not doing well or get worse.   This information is not intended to replace advice given to you by your health care provider. Make sure you discuss any questions you have with your health care provider.   Document Released: 10/24/2007 Document Revised: 05/12/2013 Document Reviewed: 07/23/2011 Elsevier Interactive Patient Education 2016 [employer]  Heart Failure Heart failure is a condition in which the heart has trouble pumping blood. This means your heart does not pump blood efficiently for your body to work well. In some cases of heart failure, fluid may back up into your lungs or you may  have swelling (edema) in your lower legs. Heart failure is usually a long-term (chronic) condition. It is important for you to take good care of yourself and follow your health care provider's treatment plan. CAUSES  Some health conditions can cause heart failure. Those health conditions include:  High blood pressure (hypertension). Hypertension causes the heart muscle to work harder than normal. When pressure in the blood vessels is high, the heart needs to pump (contract) with more force in order to circulate blood throughout the body. High blood pressure eventually causes the heart to become stiff and weak.  Coronary artery disease (CAD). CAD is the buildup of cholesterol and fat (plaque) in the arteries of the heart. The blockage in the arteries deprives the heart muscle of oxygen and blood. This can cause chest pain and may lead to a heart attack. High blood pressure can also contribute to CAD.  Heart attack (myocardial infarction). A heart attack occurs when one or more arteries in the heart become blocked. The loss of oxygen damages the muscle tissue of the heart. When this happens, part of the heart muscle dies. The injured tissue does not contract as well and weakens the heart's ability to pump blood.  Abnormal heart valves. When the heart valves do not open and close properly, it can cause heart failure. This makes the heart muscle pump harder to keep the blood flowing.  Heart muscle disease (cardiomyopathy or myocarditis). Heart muscle disease is damage to the heart muscle from a variety of causes. These  can include drug or alcohol abuse, infections, or unknown reasons. These can increase the risk of heart failure.  Lung disease. Lung disease makes the heart work harder because the lungs do not work properly. This can cause a strain on the heart, leading it to fail.  Diabetes. Diabetes increases the risk of heart failure. High blood sugar contributes to high fat (lipid) levels in the  blood. Diabetes can also cause slow damage to tiny blood vessels that carry important nutrients to the heart muscle. When the heart does not get enough oxygen and food, it can cause the heart to become weak and stiff. This leads to a heart that does not contract efficiently.  Other conditions can contribute to heart failure. These include abnormal heart rhythms, thyroid problems, and low blood counts (anemia). Certain unhealthy behaviors can increase the risk of heart failure, including:  Being overweight.  Smoking or chewing tobacco.  Eating foods high in fat and cholesterol.  Abusing illicit drugs or alcohol.  Lacking physical activity. SYMPTOMS  Heart failure symptoms may vary and can be hard to detect. Symptoms may include:  Shortness of breath with activity, such as climbing stairs.  Persistent cough.  Swelling of the feet, ankles, legs, or abdomen.  Unexplained weight gain.  Difficulty breathing when lying flat (orthopnea).  Waking from sleep because of the need to sit up and get more air.  Rapid heartbeat.  Fatigue and loss of energy.  Feeling light-headed, dizzy, or close to fainting.  Loss of appetite.  Nausea.  Increased urination during the night (nocturia). DIAGNOSIS  A diagnosis of heart failure is based on your history, symptoms, physical examination, and diagnostic tests. Diagnostic tests for heart failure may include:  Echocardiography.  Electrocardiography.  Chest X-ray.  Blood tests.  Exercise stress test.  Cardiac angiography.  Radionuclide scans. TREATMENT  Treatment is aimed at managing the symptoms of heart failure. Medicines, behavioral changes, or surgical intervention may be necessary to treat heart failure.  Medicines to help treat heart failure may include:  Angiotensin-converting enzyme (ACE) inhibitors. This type of medicine blocks the effects of a blood protein called angiotensin-converting enzyme. ACE inhibitors relax  (dilate) the blood vessels and help lower blood pressure.  Angiotensin receptor blockers (ARBs). This type of medicine blocks the actions of a blood protein called angiotensin. Angiotensin receptor blockers dilate the blood vessels and help lower blood pressure.  Water pills (diuretics). Diuretics cause the kidneys to remove salt and water from the blood. The extra fluid is removed through urination. This loss of extra fluid lowers the volume of blood the heart pumps.  Beta blockers. These prevent the heart from beating too fast and improve heart muscle strength.  Digitalis. This increases the force of the heartbeat.  Healthy behavior changes include:  Obtaining and maintaining a healthy weight.  Stopping smoking or chewing tobacco.  Eating heart-healthy foods.  Limiting or avoiding alcohol.  Stopping illicit drug use.  Physical activity as directed by your health care provider.  Surgical treatment for heart failure may include:  A procedure to open blocked arteries, repair damaged heart valves, or remove damaged heart muscle tissue.  A pacemaker to improve heart muscle function and control certain abnormal heart rhythms.  An internal cardioverter defibrillator to treat certain serious abnormal heart rhythms.  A left ventricular assist device (LVAD) to assist the pumping ability of the heart. HOME CARE INSTRUCTIONS   Take medicines only as directed by your health care provider. Medicines are important in reducing the workload  of your heart, slowing the progression of heart failure, and improving your symptoms.  Do not stop taking your medicine unless directed by your health care provider.  Do not skip any dose of medicine.  Refill your prescriptions before you run out of medicine. Your medicines are needed every day.  Engage in moderate physical activity if directed by your health care provider. Moderate physical activity can benefit some people. The elderly and people with  severe heart failure should consult with a health care provider for physical activity recommendations.  Eat heart-healthy foods. Food choices should be free of trans fat and low in saturated fat, cholesterol, and salt (sodium). Healthy choices include fresh or frozen fruits and vegetables, fish, lean meats, legumes, fat-free or low-fat dairy products, and whole grain or high fiber foods. Talk to a dietitian to learn more about heart-healthy foods.  Limit sodium if directed by your health care provider. Sodium restriction may reduce symptoms of heart failure in some people. Talk to a dietitian to learn more about heart-healthy seasonings.  Use healthy cooking methods. Healthy cooking methods include roasting, grilling, broiling, baking, poaching, steaming, or stir-frying. Talk to a dietitian to learn more about healthy cooking methods.  Limit fluids if directed by your health care provider. Fluid restriction may reduce symptoms of heart failure in some people.  Weigh yourself every day. Daily weights are important in the early recognition of excess fluid. You should weigh yourself every morning after you urinate and before you eat breakfast. Wear the same amount of clothing each time you weigh yourself. Record your daily weight. Provide your health care provider with your weight record.  Monitor and record your blood pressure if directed by your health care provider.  Check your pulse if directed by your health care provider.  Lose weight if directed by your health care provider. Weight loss may reduce symptoms of heart failure in some people.  Stop smoking or chewing tobacco. Nicotine makes your heart work harder by causing your blood vessels to constrict. Do not use nicotine gum or patches before talking to your health care provider.  Keep all follow-up visits as directed by your health care provider. This is important.  Limit alcohol intake to no more than 1 drink per day for nonpregnant  women and 2 drinks per day for men. One drink equals 12 ounces of beer, 5 ounces of wine, or 1 ounces of hard liquor. Drinking more than that is harmful to your heart. Tell your health care provider if you drink alcohol several times a week. Talk with your health care provider about whether alcohol is safe for you. If your heart has already been damaged by alcohol or you have severe heart failure, drinking alcohol should be stopped completely.  Stop illicit drug use.  Stay up-to-date with immunizations. It is especially important to prevent respiratory infections through current pneumococcal and influenza immunizations.  Manage other health conditions such as hypertension, diabetes, thyroid disease, or abnormal heart rhythms as directed by your health care provider.  Learn to manage stress.  Plan rest periods when fatigued.  Learn strategies to manage high temperatures. If the weather is extremely hot:  Avoid vigorous physical activity.  Use air conditioning or fans or seek a cooler location.  Avoid caffeine and alcohol.  Wear loose-fitting, lightweight, and light-colored clothing.  Learn strategies to manage cold temperatures. If the weather is extremely cold:  Avoid vigorous physical activity.  Layer clothes.  Wear mittens or gloves, a hat, and a scarf  when going outside.  Avoid alcohol.  Obtain ongoing education and support as needed.  Participate in or seek rehabilitation as needed to maintain or improve independence and quality of life. SEEK MEDICAL CARE IF:   You have a rapid weight gain.  You have increasing shortness of breath that is unusual for you.  You are unable to participate in your usual physical activities.  You tire easily.  You cough more than normal, especially with physical activity.  You have any or more swelling in areas such as your hands, feet, ankles, or abdomen.  You are unable to sleep because it is hard to breathe.  You feel like your  heart is beating fast (palpitations).  You become dizzy or light-headed upon standing up. SEEK IMMEDIATE MEDICAL CARE IF:   You have difficulty breathing.  There is a change in mental status such as decreased alertness or difficulty with concentration.  You have a pain or discomfort in your chest.  You have an episode of fainting (syncope). MAKE SURE YOU:   Understand these instructions.  Will watch your condition.  Will get help right away if you are not doing well or get worse.   This information is not intended to replace advice given to you by your health care provider. Make sure you discuss any questions you have with your health care provider.   Document Released: 05/07/2005 Document Revised: 09/21/2014 Document Reviewed: 06/06/2012 Elsevier Interactive Patient Education [employer].

## 2015-08-02 NOTE — ED Provider Notes (Signed)
[facility] Emergency Department Provider Note  Time seen: 7:15 PM  I have reviewed the triage vital signs and the nursing notes.   HISTORY  Chief Complaint Shortness of Breath    HPI Linda Montgomery is a 70 y.o. female with a past medical history of diabetes, hypertension, hyperlipidemia, CHF who presents the emergency department with shortness of breath for the past 3 weeks. According to the patient for the past 3 weeks she has had progressive shortness of breath as well as intermittent left-sided chest pain. States she is worried because this feels similar to the symptoms she had in 2014 when she was diagnosed with a heart attack. Patient states a history of CHF although her doctor took her off Lasix approximately 3 months ago. Patient denies any current chest discomfort states as long as she is resting denies any shortness of breath but states shortness breath worsens with exertion. Denies any recent fever, cough, congestion.     Past Medical History  Diagnosis Date  . Diabetes mellitus without complication ([place])   . Hypertension   . Hypercholesterolemia     There are no active problems to display for this patient.   History reviewed. No pertinent past surgical history.  Current Outpatient Rx  Name  Route  Sig  Dispense  Refill  . chlorpheniramine-HYDROcodone (TUSSIONEX PENNKINETIC ER) 10-8 MG/5ML SUER   Oral   Take 5 mLs by mouth 2 (two) times daily.   140 mL   0     Allergies Review of patient's allergies indicates no known allergies.  Family History  Problem Relation Age of Onset  . Breast cancer Neg Hx     Social History Social History  Substance Use Topics  . Smoking status: Former Research scientist (life sciences)  . Smokeless tobacco: None  . Alcohol Use: No    Review of Systems Constitutional: Negative for fever. Cardiovascular: Intermittent left-sided chest discomfort. Respiratory: Positive shortness of breath Gastrointestinal: Negative for  abdominal pain Neurological: Negative for headache 10-point ROS otherwise negative.  ____________________________________________   PHYSICAL EXAM:  VITAL SIGNS: ED Triage Vitals  Enc Vitals Group     BP 08/02/15 1553 165/105 mmHg     Pulse Rate 08/02/15 1553 106     Resp 08/02/15 1553 20     Temp 08/02/15 1553 97.8 F (36.6 C)     Temp Source 08/02/15 1553 Oral     SpO2 08/02/15 1553 99 %     Weight 08/02/15 1553 226 lb (102.513 kg)     Height 08/02/15 1553 5\' 4"  (1.626 m)     Head Cir --      Peak Flow --      Pain Score --      Pain Loc --      Pain Edu? --      Excl. in [place]? --     Constitutional: Alert and oriented. Well appearing and in no distress. Eyes: Normal exam ENT   Head: Normocephalic and atraumatic.   Mouth/Throat: Mucous membranes are moist. Cardiovascular: Normal rate, regular rhythm.  Respiratory: Normal respiratory effort without tachypnea nor retractions. Breath sounds are clear and equal bilaterally. No wheezes/rales/rhonchi. Gastrointestinal: Soft and nontender. No distention.  Musculoskeletal: Nontender with normal range of motion in all extremities. No lower extremity edema or tenderness. Neurologic:  Normal speech and language. No gross focal neurologic deficits Skin:  Skin is warm, dry and intact.  Psychiatric: Mood and affect are normal. Speech and behavior are normal.  ____________________________________________    EKG  EKG reviewed and interpreted by myself shows sinus tachycardia 102 bpm, narrow QRS, normal axis, normal intervals, no ST changes. Largely normal EKG.  ____________________________________________    RADIOLOGY  Chest x-ray consistent with mild CHF/volume overload  ____________________________________________    INITIAL IMPRESSION / ASSESSMENT AND PLAN / ED COURSE  Pertinent labs & imaging results that were available during my care of the patient were reviewed by me and considered in my medical decision  making (see chart for details).  Patient presents with 3 weeks of shortness of breath, worse with exertion. Patient states her doctor took her off Lasix proximally 3 months ago. Patient has clear lung sounds bilaterally, no wheeze heard. Patient's labs are largely within normal limits including negative troponin. Patient's chest x-ray consistent with mild CHF fluid overload. Patient currently satting 100% on room air. We will recheck a troponin. If second troponin is negative we will discharge and a 7 day course of Lasix. Patient follows up with Dr. Ubaldo Glassing, and will call for an appointment. Patient is agreeable to this plan.   Patient's repeat troponin is negative. We'll discharge patient with 7 days of Lasix, and follow-up with cardiology. Patient agreeable to this plan. ____________________________________________   FINAL CLINICAL IMPRESSION(S) / ED DIAGNOSES  Shortness breath CHF exacerbation  Harvest Dark, MD 08/02/15 2030

## 2015-08-02 NOTE — ED Notes (Signed)
Patient to ER for shortness of breath and heaviness to left side of chest. Patient states she has had wheezing at night for approx 3 weeks. States this weeks she gets short of breath with walking. Reports similar feelings when she had MI in 2014.

## 2015-08-07 ENCOUNTER — Encounter: Payer: Self-pay | Admitting: Emergency Medicine

## 2015-08-07 ENCOUNTER — Emergency Department: Payer: Medicare Other

## 2015-08-07 ENCOUNTER — Inpatient Hospital Stay
Admission: EM | Admit: 2015-08-07 | Discharge: 2015-08-08 | DRG: 291 | Disposition: A | Discharge status: Home / Self Care | Payer: Medicare Other | Attending: Internal Medicine | Admitting: Internal Medicine

## 2015-08-07 ENCOUNTER — Inpatient Hospital Stay
Admit: 2015-08-07 | Discharge: 2015-08-07 | Disposition: A | Discharge status: Home / Self Care | Payer: Medicare Other | Attending: Internal Medicine | Admitting: Internal Medicine

## 2015-08-07 DIAGNOSIS — J810 Acute pulmonary edema: Secondary | ICD-10-CM | POA: Diagnosis present

## 2015-08-07 DIAGNOSIS — I252 Old myocardial infarction: Secondary | ICD-10-CM

## 2015-08-07 DIAGNOSIS — R778 Other specified abnormalities of plasma proteins: Secondary | ICD-10-CM

## 2015-08-07 DIAGNOSIS — J449 Chronic obstructive pulmonary disease, unspecified: Secondary | ICD-10-CM | POA: Diagnosis present

## 2015-08-07 DIAGNOSIS — I5023 Acute on chronic systolic (congestive) heart failure: Secondary | ICD-10-CM | POA: Insufficient documentation

## 2015-08-07 DIAGNOSIS — I2510 Atherosclerotic heart disease of native coronary artery without angina pectoris: Secondary | ICD-10-CM | POA: Diagnosis present

## 2015-08-07 DIAGNOSIS — I340 Nonrheumatic mitral (valve) insufficiency: Secondary | ICD-10-CM | POA: Diagnosis present

## 2015-08-07 DIAGNOSIS — J9601 Acute respiratory failure with hypoxia: Secondary | ICD-10-CM | POA: Diagnosis present

## 2015-08-07 DIAGNOSIS — I429 Cardiomyopathy, unspecified: Secondary | ICD-10-CM | POA: Diagnosis present

## 2015-08-07 DIAGNOSIS — R1310 Dysphagia, unspecified: Secondary | ICD-10-CM | POA: Diagnosis present

## 2015-08-07 DIAGNOSIS — Z79899 Other long term (current) drug therapy: Secondary | ICD-10-CM | POA: Diagnosis not present

## 2015-08-07 DIAGNOSIS — E119 Type 2 diabetes mellitus without complications: Secondary | ICD-10-CM

## 2015-08-07 DIAGNOSIS — Z87891 Personal history of nicotine dependence: Secondary | ICD-10-CM | POA: Diagnosis not present

## 2015-08-07 DIAGNOSIS — I248 Other forms of acute ischemic heart disease: Secondary | ICD-10-CM | POA: Diagnosis present

## 2015-08-07 DIAGNOSIS — Z7982 Long term (current) use of aspirin: Secondary | ICD-10-CM

## 2015-08-07 DIAGNOSIS — R000 Tachycardia, unspecified: Secondary | ICD-10-CM | POA: Diagnosis present

## 2015-08-07 DIAGNOSIS — I110 Hypertensive heart disease with heart failure: Secondary | ICD-10-CM | POA: Diagnosis present

## 2015-08-07 DIAGNOSIS — E785 Hyperlipidemia, unspecified: Secondary | ICD-10-CM | POA: Diagnosis present

## 2015-08-07 DIAGNOSIS — R7989 Other specified abnormal findings of blood chemistry: Secondary | ICD-10-CM

## 2015-08-07 DIAGNOSIS — K219 Gastro-esophageal reflux disease without esophagitis: Secondary | ICD-10-CM | POA: Diagnosis present

## 2015-08-07 DIAGNOSIS — I509 Heart failure, unspecified: Secondary | ICD-10-CM

## 2015-08-07 DIAGNOSIS — I501 Left ventricular failure: Secondary | ICD-10-CM | POA: Diagnosis present

## 2015-08-07 HISTORY — DX: Atherosclerotic heart disease of native coronary artery without angina pectoris: I25.10

## 2015-08-07 HISTORY — DX: Heart failure, unspecified: I50.9

## 2015-08-07 HISTORY — DX: Pure hypercholesterolemia, unspecified: E78.00

## 2015-08-07 LAB — CBC
HCT: 48.1 % — ABNORMAL HIGH (ref 35.0–47.0)
HCT: 41.9 % (ref 35.0–47.0)
Hemoglobin: 15.6 g/dL (ref 12.0–16.0)
Hemoglobin: 13.8 g/dL (ref 12.0–16.0)
MCH: 27.5 pg (ref 26.0–34.0)
MCH: 28 pg (ref 26.0–34.0)
MCHC: 32.5 g/dL (ref 32.0–36.0)
MCHC: 32.9 g/dL (ref 32.0–36.0)
MCV: 84.7 fL (ref 80.0–100.0)
MCV: 85.2 fL (ref 80.0–100.0)
PLATELETS: 301 10*3/uL (ref 150–440)
PLATELETS: 256 10*3/uL (ref 150–440)
RBC: 5.68 MIL/uL — AB (ref 3.80–5.20)
RBC: 4.92 MIL/uL (ref 3.80–5.20)
RDW: 15.4 % — AB (ref 11.5–14.5)
RDW: 15 % — ABNORMAL HIGH (ref 11.5–14.5)
WBC: 8.8 10*3/uL (ref 3.6–11.0)
WBC: 10.7 10*3/uL (ref 3.6–11.0)

## 2015-08-07 LAB — COMPREHENSIVE METABOLIC PANEL
ALBUMIN: 4 g/dL (ref 3.5–5.0)
ALT: 45 U/L (ref 14–54)
AST: 75 U/L — ABNORMAL HIGH (ref 15–41)
Alkaline Phosphatase: 83 U/L (ref 38–126)
Anion gap: 8 (ref 5–15)
BUN: 17 mg/dL (ref 6–20)
CO2: 24 mmol/L (ref 22–32)
CREATININE: 1.11 mg/dL — AB (ref 0.44–1.00)
Calcium: 9.9 mg/dL (ref 8.9–10.3)
Chloride: 107 mmol/L (ref 101–111)
GFR calc Af Amer: 57 mL/min — ABNORMAL LOW (ref >60)
GFR, EST NON AFRICAN AMERICAN: 49 mL/min — AB (ref >60)
GLUCOSE: 237 mg/dL — AB (ref 65–99)
POTASSIUM: 4.6 mmol/L (ref 3.5–5.1)
SODIUM: 139 mmol/L (ref 135–145)
Total Bilirubin: 0.6 mg/dL (ref 0.3–1.2)
Total Protein: 7.8 g/dL (ref 6.5–8.1)

## 2015-08-07 LAB — TROPONIN I
Troponin I: 0.06 ng/mL — ABNORMAL HIGH (ref <0.031)
Troponin I: 0.06 ng/mL — ABNORMAL HIGH (ref <0.031)

## 2015-08-07 LAB — GLUCOSE, CAPILLARY
GLUCOSE-CAPILLARY: 194 mg/dL — AB (ref 65–99)
GLUCOSE-CAPILLARY: 209 mg/dL — AB (ref 65–99)
Glucose-Capillary: 129 mg/dL — ABNORMAL HIGH (ref 65–99)
Glucose-Capillary: 136 mg/dL — ABNORMAL HIGH (ref 65–99)

## 2015-08-07 LAB — BASIC METABOLIC PANEL
ANION GAP: 9 (ref 5–15)
BUN: 20 mg/dL (ref 6–20)
CALCIUM: 9.3 mg/dL (ref 8.9–10.3)
CHLORIDE: 109 mmol/L (ref 101–111)
CO2: 23 mmol/L (ref 22–32)
Creatinine, Ser: 1.1 mg/dL — ABNORMAL HIGH (ref 0.44–1.00)
GFR calc Af Amer: 57 mL/min — ABNORMAL LOW (ref >60)
GFR calc non Af Amer: 49 mL/min — ABNORMAL LOW (ref >60)
GLUCOSE: 195 mg/dL — AB (ref 65–99)
Potassium: 4.5 mmol/L (ref 3.5–5.1)
Sodium: 141 mmol/L (ref 135–145)

## 2015-08-07 LAB — BRAIN NATRIURETIC PEPTIDE: B Natriuretic Peptide: 122 pg/mL — ABNORMAL HIGH (ref 0.0–100.0)

## 2015-08-07 MED ORDER — INSULIN GLARGINE 100 UNIT/ML ~~LOC~~ SOLN
20.0000 [IU] | Freq: Every day | SUBCUTANEOUS | Status: DC
  Administered 2015-08-08: 20 [IU] via SUBCUTANEOUS
  Filled 2015-08-07 (×2): qty 0.2

## 2015-08-07 MED ORDER — GLIMEPIRIDE 2 MG PO TABS
2.0000 mg | ORAL_TABLET | Freq: Every day | ORAL | Status: DC
  Administered 2015-08-08: 2 mg via ORAL
  Filled 2015-08-07: qty 1

## 2015-08-07 MED ORDER — FUROSEMIDE 10 MG/ML IJ SOLN
40.0000 mg | Freq: Two times a day (BID) | INTRAMUSCULAR | Status: DC
  Administered 2015-08-07 – 2015-08-08 (×3): 40 mg via INTRAVENOUS
  Filled 2015-08-07 (×3): qty 4

## 2015-08-07 MED ORDER — FUROSEMIDE 10 MG/ML IJ SOLN
40.0000 mg | Freq: Once | INTRAMUSCULAR | Status: AC
  Administered 2015-08-07: 40 mg via INTRAVENOUS
  Filled 2015-08-07: qty 4

## 2015-08-07 MED ORDER — ASPIRIN EC 81 MG PO TBEC
81.0000 mg | DELAYED_RELEASE_TABLET | Freq: Every day | ORAL | Status: DC
  Administered 2015-08-07 – 2015-08-08 (×2): 81 mg via ORAL
  Filled 2015-08-07 (×2): qty 1

## 2015-08-07 MED ORDER — HEPARIN SODIUM (PORCINE) 5000 UNIT/ML IJ SOLN
5000.0000 [IU] | Freq: Three times a day (TID) | INTRAMUSCULAR | Status: DC
  Administered 2015-08-07 – 2015-08-08 (×4): 5000 [IU] via SUBCUTANEOUS
  Filled 2015-08-07 (×4): qty 1

## 2015-08-07 MED ORDER — IPRATROPIUM-ALBUTEROL 0.5-2.5 (3) MG/3ML IN SOLN
3.0000 mL | RESPIRATORY_TRACT | Status: DC
  Administered 2015-08-07 – 2015-08-08 (×6): 3 mL via RESPIRATORY_TRACT
  Filled 2015-08-07 (×8): qty 3

## 2015-08-07 MED ORDER — CARVEDILOL 6.25 MG PO TABS: 6.2500 mg | ORAL_TABLET | Freq: Two times a day (BID) | ORAL | Status: DC

## 2015-08-07 MED ORDER — PANTOPRAZOLE SODIUM 40 MG PO TBEC
40.0000 mg | DELAYED_RELEASE_TABLET | Freq: Every day | ORAL | Status: DC
  Administered 2015-08-07 – 2015-08-08 (×2): 40 mg via ORAL
  Filled 2015-08-07 (×2): qty 1

## 2015-08-07 MED ORDER — METOPROLOL TARTRATE 25 MG PO TABS
25.0000 mg | ORAL_TABLET | Freq: Two times a day (BID) | ORAL | Status: DC
  Administered 2015-08-07: 25 mg via ORAL
  Filled 2015-08-07: qty 1

## 2015-08-07 MED ORDER — LISINOPRIL 5 MG PO TABS
5.0000 mg | ORAL_TABLET | Freq: Every day | ORAL | Status: DC
  Administered 2015-08-07: 5 mg via ORAL
  Filled 2015-08-07: qty 1

## 2015-08-07 MED ORDER — INSULIN ASPART 100 UNIT/ML ~~LOC~~ SOLN
0.0000 [IU] | Freq: Three times a day (TID) | SUBCUTANEOUS | Status: DC
  Administered 2015-08-07: 2 [IU] via SUBCUTANEOUS
  Administered 2015-08-07 – 2015-08-08 (×3): 3 [IU] via SUBCUTANEOUS
  Filled 2015-08-07: qty 3
  Filled 2015-08-07: qty 2
  Filled 2015-08-07 (×2): qty 3

## 2015-08-07 MED ORDER — MORPHINE SULFATE (PF) 2 MG/ML IV SOLN: 2.0000 mg | INTRAVENOUS | Status: DC | PRN

## 2015-08-07 MED ORDER — CARVEDILOL 6.25 MG PO TABS
6.2500 mg | ORAL_TABLET | Freq: Two times a day (BID) | ORAL | Status: DC
  Administered 2015-08-07 – 2015-08-08 (×2): 6.25 mg via ORAL
  Filled 2015-08-07 (×2): qty 1

## 2015-08-07 MED ORDER — FUROSEMIDE 10 MG/ML IJ SOLN: 20.0000 mg | Freq: Once | INTRAMUSCULAR | Status: DC

## 2015-08-07 NOTE — ED Notes (Signed)
Waiting for admitting physician to call back.   No changes in patient status

## 2015-08-07 NOTE — ED Notes (Signed)
Talked with hospitalist, Dr Ether Griffins.   She was notified of patients troponin- no additional orders at this time,

## 2015-08-07 NOTE — ED Provider Notes (Signed)
[facility] Emergency Department Provider Note  ____________________________________________  Time seen: Approximately 0048 AM  I have reviewed the triage vital signs and the nursing notes.   HISTORY  Chief Complaint Respiratory Distress  History Limited by patient respiratory distress  HPI Linda Montgomery is a 70 y.o. female who comes into the hospital today with some shortness of breath.The patient called EMS tonight with some chest pain and chest pressure when EMS arrived the patient was complaining of some shortness of breath and dyspnea. Per EMS the patient was spitting up some pink-looking sputum and they're concerned about flash pulmonary edema. The report that her heart rate was 120s so they placed her on CPAP. The patient's oxygen saturation per EMS in the 60s on room air when they arrived and then in 80s on CPAP. The patient was here in the emergency department 2 days ago and was told that she had some water in her lungs. The patient was placed on Lasix but she reports that she forgot to take it today. The patient sees Dr. Ubaldo Glassing is her cardiologist. She does have a history of congestive heart failure. The patient reports that she feels tight in her chest denies outright chest pain. The patient is here for treatment and evaluation.   Past Medical History  Diagnosis Date  . Diabetes mellitus without complication ([place])   . Hypertension   . Hypercholesterolemia   . High cholesterol   . CHF (congestive heart failure) ([place])   . CAD (coronary artery disease)     Patient Active Problem List   Diagnosis Date Noted  . Acute respiratory failure with hypoxia  08/07/2015  . Acute pulmonary edema with congestive heart failure ([place]) 08/07/2015  . Diabetes mellitus type 2, uncomplicated  0000000    History reviewed. No pertinent past surgical history.  Current Outpatient Rx  Name  Route  Sig  Dispense  Refill  . aspirin EC 81 MG tablet   Oral   Take 1  tablet by mouth daily.         . carvedilol (COREG) 6.25 MG tablet   Oral   Take 1 tablet by mouth 2 (two) times daily.      0   . enalapril (VASOTEC) 20 MG tablet   Oral   Take 1 tablet by mouth daily.      0   . glimepiride (AMARYL) 2 MG tablet   Oral   Take 1 tablet by mouth daily.      0   . JANUVIA 100 MG tablet   Oral   Take 1 tablet by mouth daily.      0     Dispense as written.   . metFORMIN (GLUCOPHAGE) 500 MG tablet   Oral   Take 2 tablets by mouth 2 (two) times daily.      0   . omeprazole (PRILOSEC) 20 MG capsule   Oral   Take 1 capsule by mouth daily.      1   . furosemide (LASIX) 20 MG tablet   Oral   Take 1 tablet (20 mg total) by mouth daily. Patient not taking: Reported on 08/07/2015   7 tablet   0     Allergies Review of patient's allergies indicates no known allergies.  Family History  Problem Relation Age of Onset  . Breast cancer Neg Hx     Social History Social History  Substance Use Topics  . Smoking status: Former Research scientist (life sciences)  . Smokeless tobacco: Never Used  .  Alcohol Use: No    Review of Systems Constitutional: No fever/chills Eyes: No visual changes. ENT: No sore throat. Cardiovascular:  chest pain. Respiratory:  shortness of breath. Gastrointestinal: Nausea and vomiting with no abdominal pain Genitourinary: Negative for dysuria. Musculoskeletal: Negative for back pain. Skin: Negative for rash. Neurological: Negative for headaches, focal weakness or numbness.  10-point ROS otherwise negative.  ____________________________________________   PHYSICAL EXAM:  VITAL SIGNS: ED Triage Vitals  Enc Vitals Group     BP 08/07/15 0059 140/95 mmHg     Pulse Rate 08/07/15 0059 114     Resp 08/07/15 0059 24     Temp 08/07/15 0059 97.5 F (36.4 C)     Temp Source 08/07/15 0059 Axillary     SpO2 08/07/15 0059 98 %     Weight 08/07/15 0059 226 lb (102.513 kg)     Height 08/07/15 0059 5\' 4"  (1.626 m)     Head Cir --       Peak Flow --      Pain Score 08/07/15 0101 5     Pain Loc --      Pain Edu? --      Excl. in [place]? --     Constitutional: Alert and oriented. Well appearing and in beer respiratory distress. Eyes: Conjunctivae are normal. PERRL. EOMI. Head: Atraumatic. Nose: No congestion/rhinnorhea. Mouth/Throat: Mucous membranes are moist.  Oropharynx non-erythematous. Cardiovascular: Cardiac, regular rhythm. Grossly normal heart sounds.  Good peripheral circulation. Respiratory: Increased respiratory effort.  No retractions. Coarse, rhonchorous breath sounds throughout all lung fields Gastrointestinal: Soft and nontender. No distention. Positive bowel sounds Musculoskeletal: No lower extremity tenderness nor edema.   Neurologic:  Normal speech and language.  Skin:  Skin is warm, dry and intact.  Psychiatric: Mood and affect are normal.   ____________________________________________   LABS (all labs ordered are listed, but only abnormal results are displayed)  Labs Reviewed  CBC - Abnormal; Notable for the following:    RBC 5.68 (*)    HCT 48.1 (*)    RDW 15.4 (*)    All other components within normal limits  COMPREHENSIVE METABOLIC PANEL - Abnormal; Notable for the following:    Glucose, Bld 237 (*)    Creatinine, Ser 1.11 (*)    AST 75 (*)    GFR calc non Af Amer 49 (*)    GFR calc Af Amer 57 (*)    All other components within normal limits  BRAIN NATRIURETIC PEPTIDE - Abnormal; Notable for the following:    B Natriuretic Peptide 122.0 (*)    All other components within normal limits  TROPONIN I  BASIC METABOLIC PANEL  TROPONIN I  TROPONIN I  THYROID PANEL WITH TSH  CBC  HEMOGLOBIN A1C   ____________________________________________  EKG  ED ECG REPORT I, Loney Hering, the attending physician, personally viewed and interpreted this ECG.   Date: 08/07/2015  EKG Time: 103  Rate: 112  Rhythm: sinus tachycardia  Axis: normal  Intervals:none  ST&T Change:  non  ____________________________________________  RADIOLOGY  Chest x-ray: New central airspace opacities likely congestive heart failure with alveolar edema, infectious infiltrates are not excluded. ____________________________________________   PROCEDURES  Procedure(s) performed: None  Critical Care performed: Yes, see critical care note(s)  CRITICAL CARE Performed by: Charlesetta Ivory P   Total critical care time: 30 minutes  Critical care time was exclusive of separately billable procedures and treating other patients.  Critical care was necessary to treat or prevent imminent or  life-threatening deterioration.  Critical care was time spent personally by me on the following activities: development of treatment plan with patient and/or surrogate as well as nursing, discussions with consultants, evaluation of patient's response to treatment, examination of patient, obtaining history from patient or surrogate, ordering and performing treatments and interventions, ordering and review of laboratory studies, ordering and review of radiographic studies, pulse oximetry and re-evaluation of patient's condition.  ____________________________________________   INITIAL IMPRESSION / ASSESSMENT AND PLAN / ED COURSE  Pertinent labs & imaging results that were available during my care of the patient were reviewed by me and considered in my medical decision making (see chart for details).  This is a 70 year old female with a history of CHF who comes in with acute shortness of breath and spitting up pink phlegm concerning for pulmonary edema. The patient was on CPAP when she arrived by EMS but her sats decreased as we switched her over to BiPAP. On the BiPAP the patient did have some improvement in her shortness of breath as well as her oxygen saturation. I did give the patient a dose of Lasix 40 mg IV times one dose given her previous visit with pulmonary edema. Although the patient's BNP is  normal I feel that the patient needs to be admitted for pulmonary edema. She was tachycardic initially on the BiPAP but we were able to wean her pressure as well as her oxygen saturation. I contacted the hospitalist service and they will admit the patient to their service. At this time the patient's blood pressure is unremarkable. ____________________________________________   FINAL CLINICAL IMPRESSION(S) / ED DIAGNOSES  Final diagnoses:  Acute pulmonary edema ([place])  Acute on chronic congestive heart failure, unspecified congestive heart failure type (HCC)      Loney Hering, MD 08/07/15 [phone]

## 2015-08-07 NOTE — H&P (Signed)
[facility] NAME: Sharmayne Carling    MR#:  [id number]  DATE OF BIRTH:  [birth year]  DATE OF ADMISSION:  08/07/2015  PRIMARY CARE PHYSICIAN: Glendon Axe, MD   REQUESTING/REFERRING PHYSICIAN: Dr Dahlia Client  CHIEF COMPLAINT:   Chief Complaint  Patient presents with  . Respiratory Distress   HISTORY OF PRESENT ILLNESS: Emmer Lowmaster  is a 70 y.o. female with a known history of Congestive heart failure (EF unknown), diabetes mellitus, hypertension, hyperlipidemia and history of coronary artery disease status post MI several years ago. She was initially seen in the ED 5 days ago for a 3 week history of gradually worsening shortness of breath on exertion as well as intermittent left-sided chest pain. She was found to have pulmonary vascular congestion at that time and was given Lasix. She was scheduled to follow-up with her PCP. Patient has been taking her Lasix since then and felt slightly better. However, overnight, she had an argument with her son and suddenly developed shortness of breath that was very significant. She did not have any chest pain or palpitations no dizziness or confusion. She however had nausea and dry heaves. She denies any abdominal pain, bleeding from any orifice or changes in her bowel or urine habits. She has not been started on any other medication apart from Lasix in the recent past. She denies any travel or contact with sick persons and she only had minimal nonproductive cough that started with her worsening shortness of breath. She denies any wheezing.  On arrival in the ED, patient was noted to have O2 sats of 61% on room air and was tachycardic at 115 with tachypnea of 25. Patient was immediately started on BiPAP and was given 40 mg of Lasix which improved her respiratory status. Chest x-ray showed significant pulmonary edema and EKG showed sinus tachycardia at a rate of 112 as well as left ventricle hypertrophy.  Troponin was negative and CBC was unremarkable. BNP was also unremarkable except for a glucose of 237. BNP was 122. Patient will be admitted due to acute pulmonary edema secondary to CHF exacerbation.  She wishes to be full code.  PAST MEDICAL HISTORY:   Past Medical History  Diagnosis Date  . Diabetes mellitus without complication ([place])   . Hypertension   . Hypercholesterolemia   . High cholesterol   . CHF (congestive heart failure) ([place])   . CAD (coronary artery disease)     PAST SURGICAL HISTORY: History reviewed. No pertinent past surgical history.  SOCIAL HISTORY:  Social History  Substance Use Topics  . Smoking status: Former Research scientist (life sciences)  . Smokeless tobacco: Never Used  . Alcohol Use: No    FAMILY HISTORY:  Family History  Problem Relation Age of Onset  . Breast cancer Neg Hx     DRUG ALLERGIES: No Known Allergies  REVIEW OF SYSTEMS:   CONSTITUTIONAL: No fever, fatigue or weakness.  EYES: No blurred or double vision.  EARS, NOSE, AND THROAT: No tinnitus or ear pain.  RESPIRATORY: As in history of present illness CARDIOVASCULAR: As in history of present illness GASTROINTESTINAL: As in history of present illness  GENITOURINARY: No dysuria, hematuria.  ENDOCRINE: No polyuria, nocturia,  HEMATOLOGY: No anemia, easy bruising or bleeding SKIN: No rash or lesion. MUSCULOSKELETAL: No joint pain or arthritis.   NEUROLOGIC: No tingling, numbness, weakness.  PSYCHIATRY: No anxiety or depression.   MEDICATIONS AT HOME:  Prior to Admission medications   Medication Sig Start Date End  Date Taking? Authorizing Provider  aspirin EC 81 MG tablet Take 1 tablet by mouth daily.   Yes Historical Provider, MD  carvedilol (COREG) 6.25 MG tablet Take 1 tablet by mouth 2 (two) times daily. 07/21/15  Yes Historical Provider, MD  enalapril (VASOTEC) 20 MG tablet Take 1 tablet by mouth daily. 07/27/15  Yes Historical Provider, MD  glimepiride (AMARYL) 2 MG tablet Take 1 tablet by mouth daily.  07/21/15  Yes Historical Provider, MD  JANUVIA 100 MG tablet Take 1 tablet by mouth daily. 07/21/15  Yes Historical Provider, MD  metFORMIN (GLUCOPHAGE) 500 MG tablet Take 2 tablets by mouth 2 (two) times daily. 07/11/15  Yes Historical Provider, MD  omeprazole (PRILOSEC) 20 MG capsule Take 1 capsule by mouth daily. 08/02/15  Yes Historical Provider, MD  furosemide (LASIX) 20 MG tablet Take 1 tablet (20 mg total) by mouth daily. Patient not taking: Reported on 08/07/2015 08/02/15 08/01/16  Harvest Dark, MD      PHYSICAL EXAMINATION:   VITAL SIGNS: Blood pressure 125/71, pulse 109, temperature 97.5 F (36.4 C), temperature source Axillary, resp. rate 25, height 5\' 4"  (1.626 m), weight 102.513 kg (226 lb), SpO2 97 %.  GENERAL:  70 y.o.-year-old patient lying in the bed with moderate respiratory distress on BiPAP. Alert and oriented x 3. EYES: Pupils equal, round, reactive to light and accommodation. No scleral icterus. Extraocular muscles intact.  HEENT: Head atraumatic, normocephalic. Oropharynx and nasopharynx clear.  NECK:  Supple, no jugular venous distention. No thyroid enlargement, no tenderness.  LUNGS: Patient is tachypneic and has good air entry with generalized coarse crackles as well as scattered expiratory wheezes in all lung zones.  CARDIOVASCULAR: Tachycardia, S1, S2 normal. No murmurs, rubs, or gallops.  ABDOMEN: Soft, nontender, nondistended. Bowel sounds present. No organomegaly or mass.  EXTREMITIES: No pedal edema, cyanosis, or clubbing.  NEUROLOGIC: Cranial nerves II through XII are intact. Muscle strength 5/5 in all extremities. Sensation intact. Gait not checked.   SKIN: No obvious rash, lesion, or ulcer.   LABORATORY PANEL:   CBC  Recent Labs Lab 08/02/15 1600 08/07/15 0127  WBC 6.3 8.8  HGB 13.7 15.6  HCT 41.8 48.1*  PLT 306 301  MCV 83.4 84.7  MCH 27.3 27.5  MCHC 32.7 32.5  RDW 15.4* 15.4*    ------------------------------------------------------------------------------------------------------------------  Chemistries   Recent Labs Lab 08/02/15 1600 08/07/15 0127  NA 140 139  K 3.9 4.6  CL 108 107  CO2 23 24  GLUCOSE 79 237*  BUN 15 17  CREATININE 0.87 1.11*  CALCIUM 10.0 9.9  AST  --  75*  ALT  --  45  ALKPHOS  --  83  BILITOT  --  0.6   ------------------------------------------------------------------------------------------------------------------ estimated creatinine clearance is 54.2 mL/min (by C-G formula based on Cr of 1.11). ------------------------------------------------------------------------------------------------------------------ No results for input(s): TSH, T4TOTAL, T3FREE, THYROIDAB in the last 72 hours.  Invalid input(s): FREET3   Coagulation profile No results for input(s): INR, PROTIME in the last 168 hours. ------------------------------------------------------------------------------------------------------------------- No results for input(s): DDIMER in the last 72 hours. -------------------------------------------------------------------------------------------------------------------  Cardiac Enzymes  Recent Labs Lab 08/02/15 1600 08/02/15 1920 08/07/15 0127  TROPONINI <0.03 <0.03 <0.03   ------------------------------------------------------------------------------------------------------------------ Invalid input(s): POCBNP  ---------------------------------------------------------------------------------------------------------------  Urinalysis No results found for: COLORURINE, APPEARANCEUR, LABSPEC, PHURINE, GLUCOSEU, HGBUR, BILIRUBINUR, KETONESUR, PROTEINUR, UROBILINOGEN, NITRITE, LEUKOCYTESUR   RADIOLOGY: Dg Chest Portable 1 View  08/07/2015  CLINICAL DATA:  Sudden onset of respiratory distress tonight. Similar symptoms 5 days ago prompted a visit to the ER.  EXAM: PORTABLE CHEST 1 VIEW COMPARISON:  08/02/2015  FINDINGS: Multifocal patchy alveolar opacities are present, new or worsened from 08/02/2015. This may represent alveolar edema, and there is mild vascular fullness. This could represent congestive heart failure. Infectious infiltrate less likely but not excluded. No large effusions. No pneumothorax. IMPRESSION: New central airspace opacities, likely congestive heart failure with alveolar edema. Infectious infiltrates are not excluded. Electronically Signed   By: Andreas Newport M.D.   On: 08/07/2015 01:23    EKG: Orders placed or performed during the hospital encounter of 08/07/15  . ED EKG  . ED EKG  . EKG 12-Lead  . EKG 12-Lead    ASSESSMENT  Principal Problem:   Acute pulmonary edema with congestive heart failure (HCC) Active Problems:   Acute respiratory failure with hypoxia    Diabetes mellitus type 2, uncomplicated   PLAN   1). Acute pulmonary edema with congestive heart failure resulting in acute respiratory failure with hypoxia - Patient presented with acute pulmonary edema with hypoxic respiratory failure. O2 sats was 61% initially and patient was immediately placed on BiPAP. After receiving 40 mg of Lasix in the ED, BiPAP settings with decreased and patient is currently able to speak full sentences on BiPAP. - Continue IV Lasix at 40 mg twice a day. Also continue enalapril and Coreg as well as aspirin. - Continue O2 supplementation and when patient from BiPAP when possible. Continue DuoNeb's. - Initial troponin was negative. Obtain 2 more sets to rule out ACS. Patient doesn't have chest pain. - Obtain TSH and 2-D echo - Ensure intake and output measurement as well as daily weights and fluid restriction. - Patient will need CHF education.  2). Type 2 diabetes mellitus - Patient has a history of type 2 diabetes and presented with a glucose of 237. Last A1c is unknown. We'll check A1c. - Continue glimepiride but hold Januvia and metformin. Start Lantus at 20 units daily at  bedtime and start sliding scale insulin. - Consistent carbohydrate diet  All the records are reviewed and case discussed with ED provider. Management plans discussed with the patient, family and they are in agreement.  CODE STATUS:    Code Status Orders        Start     Ordered   08/07/15 0358  Full code   Continuous     08/07/15 0359    Code Status History    Date Active Date Inactive Code Status Order ID Comments User Context   This patient has a current code status but no historical code status.      I have independently reviewed all EKG and chest x-ray data  VTE prophylaxis: Heparin if no contraindications and patient at low risk for bleeding. SCD's and progressive ambulation if patient has contraindications to anticoagulants. No DVT prophylaxis if patient presently receiving therapeutic anticoagulation or is at significant risk of bleeding for which the risk of anticoagulation outweigh the potential benefits.  Vaccinations: Pneumonia & flu vaccine per hospital protocol Prevention: Will proceed with conservative measures for the prevention of delirium in patients older than 65. Fall precautions and 1:1 sitter as needed per hospital protocol.  TOTAL TIME TAKING CARE OF THIS PATIENT: 50 minutes.    Sylvan Cheese M.D on 08/07/2015 at 4:00 AM  Between 7am to 6pm - Pager - [phone]  After 6pm go to [email or web address] - password [email or web address] [facility]  Office  [phone]  CC: Primary care physician; Glendon Axe, MD

## 2015-08-07 NOTE — ED Notes (Signed)
Pt from home with resp distress. Arrived to ed with pox of 61% ON CPAP. Pt able to speak in full sentences through bipap maske with rt in room.

## 2015-08-07 NOTE — Progress Notes (Signed)
3 L of oxygen. NSR. Takes meds ok. A & O. ECHO was done. Pt ambulating in the room and tolerated it well. Pt has no further concerns at this time.

## 2015-08-07 NOTE — ED Notes (Signed)
Meal tray given 

## 2015-08-07 NOTE — ED Notes (Signed)
Paged prime doc to notifiy of critical lab value- waiting for response

## 2015-08-07 NOTE — ED Notes (Signed)
Pt tolerating nasal canula well at this time.

## 2015-08-07 NOTE — Progress Notes (Signed)
[facility] [place] NAME: Linda Montgomery    MR#:  [id number]  DATE OF BIRTH:  Mar 19, 1946  SUBJECTIVE:  CHIEF COMPLAINT:   Chief Complaint  Patient presents with  . Respiratory Distress   the patient is 70 year old African-American female with past medical history significant for history of congestive heart failure, diabetes mellitus, hypertension, hyperlipidemia, coronary artery disease, status post MI in the past who presents to the hospital with complaints of shortness of breath, which is going on for the past 3 weeks, cough, wheezing. On arrival to the hospital patient was noted to have pulmonary vascular congestion and the was given Lasix. She was scheduled to follow-up with her primary care physician. Overnight, however, she had a argument with her son and developed shortness of breath and presented to emergency room for further evaluation and treatment. In emergency room, she was noted to be hypoxic with O2 sats in 60s, tachycardic, tachypneic, she was initiated on BiPAP, given Lasix with improved clinical status. Chest x-ray showed pulmonary edema. EKG revealed sinus tachycardia, LVH. The patient's troponin was unremarkable. Initially, however, increased to 0.06 on the second and third tests. Patient is now weaned to 3 L of oxygen through nasal cannula and feels comfortable, however, complains of dry cough, intermittent grayish phlegm production, wheezing  Review of Systems  Constitutional: Negative for fever, chills and weight loss.  HENT: Negative for congestion.   Eyes: Negative for blurred vision and double vision.  Respiratory: Positive for cough, sputum production, shortness of breath and wheezing.   Cardiovascular: Positive for leg swelling. Negative for chest pain, palpitations, orthopnea and PND.  Gastrointestinal: Negative for nausea, vomiting, abdominal pain, diarrhea, constipation and blood in stool.  Genitourinary: Negative  for dysuria, urgency, frequency and hematuria.  Musculoskeletal: Negative for falls.  Neurological: Negative for dizziness, tremors, focal weakness and headaches.  Endo/Heme/Allergies: Does not bruise/bleed easily.  Psychiatric/Behavioral: Negative for depression. The patient does not have insomnia.     VITAL SIGNS: Blood pressure 152/81, pulse 103, temperature 98.2 F (36.8 C), temperature source Oral, resp. rate 20, height 5\' 4"  (1.626 m), weight 102.513 kg (226 lb), SpO2 94 %.  PHYSICAL EXAMINATION:   GENERAL:  70 y.o.-year-old patient lying in the bed in mild respiratory distress, intermittent cough.  EYES: Pupils equal, round, reactive to light and accommodation. No scleral icterus. Extraocular muscles intact.  HEENT: Head atraumatic, normocephalic. Oropharynx and nasopharynx clear.  NECK:  Supple, no jugular venous distention. No thyroid enlargement, no tenderness.  LUNGS: Tenderness breath sounds bilaterally, few scattered wheezes, no significant rales,rhonchi or crepitation. Intermittent use of accessory muscles of respiration.  CARDIOVASCULAR: S1, S2 normal. No murmurs, rubs, or gallops.  ABDOMEN: Soft, nontender, nondistended. Bowel sounds present. No organomegaly or mass.  EXTREMITIES: Trace to 1+ lower extremity and pedal edema, no cyanosis, or clubbing.  NEUROLOGIC: Cranial nerves II through XII are intact. Muscle strength 5/5 in all extremities. Sensation intact. Gait not checked.  PSYCHIATRIC: The patient is alert and oriented x 3.  SKIN: No obvious rash, lesion, or ulcer.   ORDERS/RESULTS REVIEWED:   CBC  Recent Labs Lab 08/02/15 1600 08/07/15 0127 08/07/15 0619  WBC 6.3 8.8 10.7  HGB 13.7 15.6 13.8  HCT 41.8 48.1* 41.9  PLT 306 301 256  MCV 83.4 84.7 85.2  MCH 27.3 27.5 28.0  MCHC 32.7 32.5 32.9  RDW 15.4* 15.4* 15.0*   ------------------------------------------------------------------------------------------------------------------  Chemistries   Recent  Labs Lab 08/02/15 1600 08/07/15 0127 08/07/15  [id number]  NA 140 139 141  K 3.9 4.6 4.5  CL 108 107 109  CO2 23 24 23   GLUCOSE 79 237* 195*  BUN 15 17 20   CREATININE 0.87 1.11* 1.10*  CALCIUM 10.0 9.9 9.3  AST  --  75*  --   ALT  --  45  --   ALKPHOS  --  83  --   BILITOT  --  0.6  --    ------------------------------------------------------------------------------------------------------------------ estimated creatinine clearance is 54.7 mL/min (by C-G formula based on Cr of 1.1). ------------------------------------------------------------------------------------------------------------------ No results for input(s): TSH, T4TOTAL, T3FREE, THYROIDAB in the last 72 hours.  Invalid input(s): FREET3  Cardiac Enzymes  Recent Labs Lab 08/07/15 0127 08/07/15 0619 08/07/15 0953  TROPONINI <0.03 0.06* 0.06*   ------------------------------------------------------------------------------------------------------------------ Invalid input(s): POCBNP ---------------------------------------------------------------------------------------------------------------  RADIOLOGY: Dg Chest Portable 1 View  08/07/2015  CLINICAL DATA:  Sudden onset of respiratory distress tonight. Similar symptoms 5 days ago prompted a visit to the ER. EXAM: PORTABLE CHEST 1 VIEW COMPARISON:  08/02/2015 FINDINGS: Multifocal patchy alveolar opacities are present, new or worsened from 08/02/2015. This may represent alveolar edema, and there is mild vascular fullness. This could represent congestive heart failure. Infectious infiltrate less likely but not excluded. No large effusions. No pneumothorax. IMPRESSION: New central airspace opacities, likely congestive heart failure with alveolar edema. Infectious infiltrates are not excluded. Electronically Signed   By: Andreas Newport M.D.   On: 08/07/2015 01:23    EKG:  Orders placed or performed during the hospital encounter of 08/07/15  . ED EKG  . ED EKG  . EKG  12-Lead  . EKG 12-Lead    ASSESSMENT AND PLAN:  Principal Problem:   Acute pulmonary edema with congestive heart failure (HCC) Active Problems:   Acute respiratory failure with hypoxia    Diabetes mellitus type 2, uncomplicated   #1. Acute  congestive heart failure with acute pulmonary edema, improved with diuretics, continue Lasix intravenously, following in's and outs, patient's weight, getting cardiologist involved for further recommendations. Initiating patient on Zestril #2. Elevated troponin, likely demand ischemia, patient had no chest pains prior to coming to the hospital, continue Coreg, aspirin, heparin subcutaneously, getting cardiologist involved for further recommendations. #3. Acute Respiratory failure with hypoxia, improved with medications, continue oxygen, weaning to room air as tolerated. #4. Diabetes mellitus type 2, uncomplicated, continue patient on Amaryl, sliding scale insulin, insulin Lantus  Management plans discussed with the patient, family and they are in agreement.   DRUG ALLERGIES: No Known Allergies  CODE STATUS:     Code Status Orders        Start     Ordered   08/07/15 0358  Full code   Continuous     08/07/15 0359    Code Status History    Date Active Date Inactive Code Status Order ID Comments User Context   This patient has a current code status but no historical code status.      TOTAL TIME TAKING CARE OF THIS PATIENT: 35 minutes.    Theodoro Grist M.D on 08/07/2015 at 4:37 PM  Between 7am to 6pm - Pager - [phone]  After 6pm go to [email or web address] - password [email or web address] [facility]  Office  [phone]  CC: Primary care physician; Glendon Axe, MD

## 2015-08-07 NOTE — ED Notes (Signed)
Notified prime dr that telemetry is full. md said to hold patient in ed until bed available.

## 2015-08-07 NOTE — ED Notes (Signed)
Pt resting comfortably.  Informed that pt would be moved to floor once a bed became available.  Repositioned pt.  Pt in NAD at this time.

## 2015-08-07 NOTE — Progress Notes (Signed)
*  PRELIMINARY RESULTS* Echocardiogram 2D Echocardiogram has been performed.  Linda Montgomery 08/07/2015, 2:22 PM

## 2015-08-07 NOTE — ED Notes (Signed)
Report received from Kerry RN

## 2015-08-07 NOTE — ED Notes (Signed)
O2 via nasal cannula was titrated down to 3 LPM per orders to titrate to maintain sats > 90%

## 2015-08-08 DIAGNOSIS — R7989 Other specified abnormal findings of blood chemistry: Secondary | ICD-10-CM

## 2015-08-08 DIAGNOSIS — R778 Other specified abnormalities of plasma proteins: Secondary | ICD-10-CM

## 2015-08-08 LAB — ECHOCARDIOGRAM COMPLETE
HEIGHTINCHES: 64 in
WEIGHTICAEL: 3616 [oz_av]

## 2015-08-08 LAB — GLUCOSE, CAPILLARY
GLUCOSE-CAPILLARY: 181 mg/dL — AB (ref 65–99)
Glucose-Capillary: 163 mg/dL — ABNORMAL HIGH (ref 65–99)

## 2015-08-08 LAB — THYROID PANEL WITH TSH
Free Thyroxine Index: 2.5 (ref 1.2–4.9)
T3 Uptake Ratio: 29 % (ref 24–39)
T4, Total: 8.6 ug/dL (ref 4.5–12.0)
TSH: 1.2 u[IU]/mL (ref 0.450–4.500)

## 2015-08-08 LAB — BASIC METABOLIC PANEL
Anion gap: 5 (ref 5–15)
BUN: 21 mg/dL — ABNORMAL HIGH (ref 6–20)
CALCIUM: 9.1 mg/dL (ref 8.9–10.3)
CO2: 29 mmol/L (ref 22–32)
CREATININE: 1.12 mg/dL — AB (ref 0.44–1.00)
Chloride: 105 mmol/L (ref 101–111)
GFR calc non Af Amer: 48 mL/min — ABNORMAL LOW (ref >60)
GFR, EST AFRICAN AMERICAN: 56 mL/min — AB (ref >60)
Glucose, Bld: 184 mg/dL — ABNORMAL HIGH (ref 65–99)
Potassium: 3.7 mmol/L (ref 3.5–5.1)
Sodium: 139 mmol/L (ref 135–145)

## 2015-08-08 LAB — HEMOGLOBIN A1C: Hgb A1c MFr Bld: 6.6 % — ABNORMAL HIGH (ref 4.0–6.0)

## 2015-08-08 MED ORDER — ATORVASTATIN CALCIUM 40 MG PO TABS: 40.0000 mg | ORAL_TABLET | Freq: Every day | ORAL | Status: AC

## 2015-08-08 MED ORDER — FUROSEMIDE 20 MG PO TABS: 20.0000 mg | ORAL_TABLET | Freq: Every day | ORAL | Status: AC

## 2015-08-08 NOTE — Discharge Summary (Signed)
[facility] [place] NAME: Linda Montgomery    MR#:  [id number]  DATE OF BIRTH:  18-Apr-1946  DATE OF ADMISSION:  08/07/2015 ADMITTING PHYSICIAN: Theodoro Grist, MD  DATE OF DISCHARGE: No discharge date for patient encounter.  PRIMARY CARE PHYSICIAN: Singh,Jasmine, MD     ADMISSION DIAGNOSIS:  Acute pulmonary edema (HCC) [J81.0] Acute on chronic congestive heart failure, unspecified congestive heart failure type ([place]) [I50.9]  DISCHARGE DIAGNOSIS:  Principal Problem:   Acute pulmonary edema with congestive heart failure (HCC) Active Problems:   Acute respiratory failure with hypoxia    Elevated troponin level   Diabetes mellitus type 2, uncomplicated    SECONDARY DIAGNOSIS:   Past Medical History  Diagnosis Date  . Diabetes mellitus without complication ([place])   . Hypertension   . Hypercholesterolemia   . High cholesterol   . CHF (congestive heart failure) ([place])   . CAD (coronary artery disease)     .pro HOSPITAL COURSE:   the patient is 70 year old African-American female with past medical history significant for history of congestive heart failure, Cardiomyopathy, ejection fraction of 20-30%, diabetes mellitus, hypertension, hyperlipidemia, coronary artery disease, status post MI in the past who presents to the hospital with complaints of shortness of breath, which is going on for the past 3 weeks, cough, wheezing. On arrival to the hospital patient was noted to have pulmonary vascular congestion and the was given Lasix. She was scheduled to follow-up with her primary care physician. Overnight, however, she had a argument with her son and developed acute shortness of breath and presented to emergency room for further evaluation and treatment. In emergency room, she was noted to be hypoxic with O2 sats in 60s, tachycardic, tachypneic, she was initiated on BiPAP, given Lasix intravenously with improved clinical status. Chest x-ray  showed pulmonary edema. EKG revealed sinus tachycardia, LVH. The patient's troponin was unremarkable initially, however, increased to 0.06 on the second and third tests. Patient was weaned down to room air and felt satisfactory, as time progressed. She was seen by cardiologist who recommended to continue conservative therapy, no interventions were recommended.. Echocardiogram done during this admission revealed ejection fraction of 35-40%, diffuse hypokinesis, mild mitral regurgitation.. Patient was felt to be stable to be discharged home today Discussion by problem #1. Acute on chronic systolic CHF with acute pulmonary edema, improved on diuretics, now weaned down to room air, continue patient on Lasix at home, continue ACE inhibitor, beta blockers, follow up with cardiologist in the next few days after discharge. #2. Elevated troponin, likely demand ischemia, patient had no chest pains prior to coming to emergency room, patient is to continue Coreg, aspirin, Lipitor was added to her medication regimen. #3. Acute respiratory failure with hypoxia, improved with medication therapy, patient was weaned off BiPAP, oxygen to room air by the day of discharge. #4. Diabetes mellitus type 2, uncomplicated, patient is to continue her outpatient medications, hemoglobin A1c was checked during this admission was found to be 6.6, relatively good control #5. Dysphagia, patient is complaining this of dysphagia, which was felt to be due to thyroid abnormalities by primary care physician, thyroid function tests were unremarkable, just 2 days ago, patient is to undergo ultrasound of thyroid in the next few days, per PCP recommendations   DISCHARGE CONDITIONS:   Stable  CONSULTS OBTAINED:  Treatment Team:  Teodoro Spray, MD  DRUG ALLERGIES:  No Known Allergies  DISCHARGE MEDICATIONS:   Current Discharge Medication List  CONTINUE these medications which have CHANGED   Details  furosemide (LASIX) 20 MG tablet  Take 1 tablet (20 mg total) by mouth daily. Qty: 30 tablet, Refills: 6      CONTINUE these medications which have NOT CHANGED   Details  aspirin EC 81 MG tablet Take 1 tablet by mouth daily.    carvedilol (COREG) 6.25 MG tablet Take 1 tablet by mouth 2 (two) times daily. Refills: 0    enalapril (VASOTEC) 20 MG tablet Take 1 tablet by mouth daily. Refills: 0    glimepiride (AMARYL) 2 MG tablet Take 1 tablet by mouth daily. Refills: 0    JANUVIA 100 MG tablet Take 1 tablet by mouth daily. Refills: 0    metFORMIN (GLUCOPHAGE) 500 MG tablet Take 2 tablets by mouth 2 (two) times daily. Refills: 0    omeprazole (PRILOSEC) 20 MG capsule Take 1 capsule by mouth daily. Refills: 1         DISCHARGE INSTRUCTIONS:    Patient is to follow-up with primary care physician, cardiologist, within 1 week after discharge  If you experience worsening of your admission symptoms, develop shortness of breath, life threatening emergency, suicidal or homicidal thoughts you must seek medical attention immediately by calling 911 or calling your MD immediately  if symptoms less severe.  You Must read complete instructions/literature along with all the possible adverse reactions/side effects for all the Medicines you take and that have been prescribed to you. Take any new Medicines after you have completely understood and accept all the possible adverse reactions/side effects.   Please note  You were cared for by a hospitalist during your hospital stay. If you have any questions about your discharge medications or the care you received while you were in the hospital after you are discharged, you can call the unit and asked to speak with the hospitalist on call if the hospitalist that took care of you is not available. Once you are discharged, your primary care physician will handle any further medical issues. Please note that NO REFILLS for any discharge medications will be authorized once you are  discharged, as it is imperative that you return to your primary care physician (or establish a relationship with a primary care physician if you do not have one) for your aftercare needs so that they can reassess your need for medications and monitor your lab values.    Today   CHIEF COMPLAINT:   Chief Complaint  Patient presents with  . Respiratory Distress    HISTORY OF PRESENT ILLNESS:  Linda Montgomery  is a 70 y.o. female with a known history of congestive heart failure, Cardiomyopathy, ejection fraction of 20-30%, diabetes mellitus, hypertension, hyperlipidemia, coronary artery disease, status post MI in the past who presents to the hospital with complaints of shortness of breath, which is going on for the past 3 weeks, cough, wheezing. On arrival to the hospital patient was noted to have pulmonary vascular congestion and the was given Lasix. She was scheduled to follow-up with her primary care physician. Overnight, however, she had a argument with her son and developed acute shortness of breath and presented to emergency room for further evaluation and treatment. In emergency room, she was noted to be hypoxic with O2 sats in 60s, tachycardic, tachypneic, she was initiated on BiPAP, given Lasix intravenously with improved clinical status. Chest x-ray showed pulmonary edema. EKG revealed sinus tachycardia, LVH. The patient's troponin was unremarkable initially, however, increased to 0.06 on the second and third tests.  Patient was weaned down to room air and felt satisfactory, as time progressed. She was seen by cardiologist who recommended to continue conservative therapy, no interventions were recommended.. Echocardiogram done during this admission revealed ejection fraction of 35-40%, diffuse hypokinesis, mild mitral regurgitation.. Patient was felt to be stable to be discharged home today Discussion by problem #1. Acute on chronic systolic CHF with acute pulmonary edema, improved on diuretics,  now weaned down to room air, continue patient on Lasix at home, continue ACE inhibitor, beta blockers, follow up with cardiologist in the next few days after discharge. #2. Elevated troponin, likely demand ischemia, patient had no chest pains prior to coming to emergency room, patient is to continue Coreg, aspirin, Lipitor was added to her medication regimen. #3. Acute respiratory failure with hypoxia, improved with medication therapy, patient was weaned off BiPAP, oxygen to room air by the day of discharge. #4. Diabetes mellitus type 2, uncomplicated, patient is to continue her outpatient medications, hemoglobin A1c was checked during this admission was found to be 6.6, relatively good control #5. Dysphagia, patient is complaining this of dysphagia, which was felt to be due to thyroid abnormalities by primary care physician, thyroid function tests were unremarkable, just 2 days ago, patient is to undergo ultrasound of thyroid in the next few days, per PCP recommendations    VITAL SIGNS:  Blood pressure 121/72, pulse 84, temperature 98.3 F (36.8 C), temperature source Oral, resp. rate 18, height 5\' 4"  (1.626 m), weight 98.431 kg (217 lb), SpO2 93 %.  I/O:   Intake/Output Summary (Last 24 hours) at 08/08/15 1425 Last data filed at 08/08/15 1352  Gross per 24 hour  Intake    600 ml  Output   4150 ml  Net  -3550 ml    PHYSICAL EXAMINATION:  GENERAL:  70 y.o.-year-old patient lying in the bed with no acute distress.  EYES: Pupils equal, round, reactive to light and accommodation. No scleral icterus. Extraocular muscles intact.  HEENT: Head atraumatic, normocephalic. Oropharynx and nasopharynx clear.  NECK:  Supple, no jugular venous distention. No thyroid enlargement, no tenderness.  LUNGS: Normal breath sounds bilaterally, no wheezing, rales,rhonchi or crepitation. No use of accessory muscles of respiration.  CARDIOVASCULAR: S1, S2 normal. No murmurs, rubs, or gallops.  ABDOMEN: Soft,  non-tender, non-distended. Bowel sounds present. No organomegaly or mass.  EXTREMITIES: No pedal edema, cyanosis, or clubbing.  NEUROLOGIC: Cranial nerves II through XII are intact. Muscle strength 5/5 in all extremities. Sensation intact. Gait not checked.  PSYCHIATRIC: The patient is alert and oriented x 3.  SKIN: No obvious rash, lesion, or ulcer.   DATA REVIEW:   CBC  Recent Labs Lab 08/07/15 0619  WBC 10.7  HGB 13.8  HCT 41.9  PLT 256    Chemistries   Recent Labs Lab 08/07/15 0127  08/08/15 0458  NA 139  < > 139  K 4.6  < > 3.7  CL 107  < > 105  CO2 24  < > 29  GLUCOSE 237*  < > 184*  BUN 17  < > 21*  CREATININE 1.11*  < > 1.12*  CALCIUM 9.9  < > 9.1  AST 75*  --   --   ALT 45  --   --   ALKPHOS 83  --   --   BILITOT 0.6  --   --   < > = values in this interval not displayed.  Cardiac Enzymes  Recent Labs Lab 08/07/15 0953  TROPONINI 0.06*  Microbiology Results  Results for orders placed or performed in visit on 08/25/13  Culture, blood (single)     Status: None   Collection Time: 08/25/13 12:20 PM  Result Value Ref Range Status   Micro Text Report   Final       COMMENT                   NO GROWTH AEROBICALLY/ANAEROBICALLY IN 5 DAYS   ANTIBIOTIC                                                      Culture, blood (single)     Status: None   Collection Time: 08/25/13 12:25 PM  Result Value Ref Range Status   Micro Text Report   Final       COMMENT                   NO GROWTH AEROBICALLY/ANAEROBICALLY IN 5 DAYS   ANTIBIOTIC                                                        RADIOLOGY:  Dg Chest Portable 1 View  08/07/2015  CLINICAL DATA:  Sudden onset of respiratory distress tonight. Similar symptoms 5 days ago prompted a visit to the ER. EXAM: PORTABLE CHEST 1 VIEW COMPARISON:  08/02/2015 FINDINGS: Multifocal patchy alveolar opacities are present, new or worsened from 08/02/2015. This may represent alveolar edema, and there is mild  vascular fullness. This could represent congestive heart failure. Infectious infiltrate less likely but not excluded. No large effusions. No pneumothorax. IMPRESSION: New central airspace opacities, likely congestive heart failure with alveolar edema. Infectious infiltrates are not excluded. Electronically Signed   By: Andreas Newport M.D.   On: 08/07/2015 01:23    EKG:   Orders placed or performed during the hospital encounter of 08/07/15  . ED EKG  . ED EKG  . EKG 12-Lead  . EKG 12-Lead      Management plans discussed with the patient, family and they are in agreement.  CODE STATUS:     Code Status Orders        Start     Ordered   08/07/15 0358  Full code   Continuous     08/07/15 0359    Code Status History    Date Active Date Inactive Code Status Order ID Comments User Context   This patient has a current code status but no historical code status.      TOTAL TIME TAKING CARE OF THIS PATIENT: 40 minutes.    Theodoro Grist M.D on 08/08/2015 at 2:25 PM  Between 7am to 6pm - Pager - [phone]  After 6pm go to [email or web address] - password [email or web address] [facility]  Office  [phone]  CC: Primary care physician; Glendon Axe, MD

## 2015-08-08 NOTE — Consult Note (Signed)
Reason for Consult: Congestive heart failure Referring Physician: Dr. Singh primary care, Dr V hospitalist  Linda Montgomery is an 69 y.o. female.  HPI: Patient's a 69-year-old female known history of coronary disease myocardial infarction diabetes hypertension former smoker presents with one to 2 weeks with shortness of breath congestion in addition to wheezing. Patient states shortness of breath exam progressively worse with dyspnea symptoms ongoing worse for the last 3 weeks was seen in the emergency room recently treated with diuretics and discharge to left follow-up with primary physician overnight and enlargement with his son developed shortness of breath denied any chest pain had nausea and dry heaves denied any blackout spells or syncope no significant palpitations as had mild cough or sputum production with worsening shortness of breath she came to emergency room. Initial evaluation in emergency room showed severe hypoxemia sats of 61%. Patient was tachycardiac at 1:15 which was sinus to A 20 French was placed on BiPAP given IV Lasix chest x-ray suggests pulmonary edema EKG was nondiagnostic borderline patient was admitted for congestive heart failure exacerbation.  Past Medical History  Diagnosis Date  . Diabetes mellitus without complication (HCC)   . Hypertension   . Hypercholesterolemia   . High cholesterol   . CHF (congestive heart failure) (HCC)   . CAD (coronary artery disease)     History reviewed. No pertinent past surgical history.  Family History  Problem Relation Age of Onset  . Breast cancer Neg Hx     Social History:  reports that she has quit smoking. She has never used smokeless tobacco. She reports that she does not drink alcohol or use illicit drugs.  Allergies: No Known Allergies  Medications: I have reviewed the patient's current medications.  Results for orders placed or performed during the hospital encounter of 08/07/15 (from the past 48 hour(s))  CBC      Status: Abnormal   Collection Time: 08/07/15  1:27 AM  Result Value Ref Range   WBC 8.8 3.6 - 11.0 K/uL   RBC 5.68 (H) 3.80 - 5.20 MIL/uL   Hemoglobin 15.6 12.0 - 16.0 g/dL   HCT 48.1 (H) 35.0 - 47.0 %   MCV 84.7 80.0 - 100.0 fL   MCH 27.5 26.0 - 34.0 pg   MCHC 32.5 32.0 - 36.0 g/dL   RDW 15.4 (H) 11.5 - 14.5 %   Platelets 301 150 - 440 K/uL  Comprehensive metabolic panel     Status: Abnormal   Collection Time: 08/07/15  1:27 AM  Result Value Ref Range   Sodium 139 135 - 145 mmol/L   Potassium 4.6 3.5 - 5.1 mmol/L   Chloride 107 101 - 111 mmol/L   CO2 24 22 - 32 mmol/L   Glucose, Bld 237 (H) 65 - 99 mg/dL   BUN 17 6 - 20 mg/dL   Creatinine, Ser 1.11 (H) 0.44 - 1.00 mg/dL   Calcium 9.9 8.9 - 10.3 mg/dL   Total Protein 7.8 6.5 - 8.1 g/dL   Albumin 4.0 3.5 - 5.0 g/dL   AST 75 (H) 15 - 41 U/L   ALT 45 14 - 54 U/L   Alkaline Phosphatase 83 38 - 126 U/L   Total Bilirubin 0.6 0.3 - 1.2 mg/dL   GFR calc non Af Amer 49 (L) >60 mL/min   GFR calc Af Amer 57 (L) >60 mL/min    Comment: (NOTE) The eGFR has been calculated using the CKD EPI equation. This calculation has not been validated in all clinical situations.   eGFR's persistently <60 mL/min signify possible Chronic Kidney Disease.    Anion gap 8 5 - 15  Troponin I     Status: None   Collection Time: 08/07/15  1:27 AM  Result Value Ref Range   Troponin I <0.03 <0.031 ng/mL    Comment:        NO INDICATION OF MYOCARDIAL INJURY.   Brain natriuretic peptide     Status: Abnormal   Collection Time: 08/07/15  2:58 AM  Result Value Ref Range   B Natriuretic Peptide 122.0 (H) 0.0 - 100.0 pg/mL  Basic metabolic panel     Status: Abnormal   Collection Time: 08/07/15  6:19 AM  Result Value Ref Range   Sodium 141 135 - 145 mmol/L   Potassium 4.5 3.5 - 5.1 mmol/L   Chloride 109 101 - 111 mmol/L   CO2 23 22 - 32 mmol/L   Glucose, Bld 195 (H) 65 - 99 mg/dL   BUN 20 6 - 20 mg/dL   Creatinine, Ser 1.10 (H) 0.44 - 1.00 mg/dL    Calcium 9.3 8.9 - 10.3 mg/dL   GFR calc non Af Amer 49 (L) >60 mL/min   GFR calc Af Amer 57 (L) >60 mL/min    Comment: (NOTE) The eGFR has been calculated using the CKD EPI equation. This calculation has not been validated in all clinical situations. eGFR's persistently <60 mL/min signify possible Chronic Kidney Disease.    Anion gap 9 5 - 15  Troponin I     Status: Abnormal   Collection Time: 08/07/15  6:19 AM  Result Value Ref Range   Troponin I 0.06 (H) <0.031 ng/mL    Comment: READ BACK AND VERIFIED WITH CARRIE ERDESKI AT 6948 08/07/15 DAS        PERSISTENTLY INCREASED TROPONIN VALUES IN THE RANGE OF 0.04-0.49 ng/mL CAN BE SEEN IN:       -UNSTABLE ANGINA       -CONGESTIVE HEART FAILURE       -MYOCARDITIS       -CHEST TRAUMA       -ARRYHTHMIAS       -LATE PRESENTING MYOCARDIAL INFARCTION       -COPD   CLINICAL FOLLOW-UP RECOMMENDED.   Thyroid Panel With TSH     Status: None   Collection Time: 08/07/15  6:19 AM  Result Value Ref Range   TSH 1.200 0.450 - 4.500 uIU/mL   T4, Total 8.6 4.5 - 12.0 ug/dL   T3 Uptake Ratio 29 24 - 39 %   Free Thyroxine Index 2.5 1.2 - 4.9    Comment: (NOTE) Performed At: [facility] [place], [place] [id number] Lindon Romp MD KX:[id number]   CBC     Status: Abnormal   Collection Time: 08/07/15  6:19 AM  Result Value Ref Range   WBC 10.7 3.6 - 11.0 K/uL   RBC 4.92 3.80 - 5.20 MIL/uL   Hemoglobin 13.8 12.0 - 16.0 g/dL   HCT 41.9 35.0 - 47.0 %   MCV 85.2 80.0 - 100.0 fL   MCH 28.0 26.0 - 34.0 pg   MCHC 32.9 32.0 - 36.0 g/dL   RDW 15.0 (H) 11.5 - 14.5 %   Platelets 256 150 - 440 K/uL  Hemoglobin A1c     Status: Abnormal   Collection Time: 08/07/15  6:19 AM  Result Value Ref Range   Hgb A1c MFr Bld 6.6 (H) 4.0 - 6.0 %  Glucose, capillary     Status:  Abnormal   Collection Time: 08/07/15  7:42 AM  Result Value Ref Range   Glucose-Capillary 194 (H) 65 - 99 mg/dL  Troponin I     Status: Abnormal   Collection  Time: 08/07/15  9:53 AM  Result Value Ref Range   Troponin I 0.06 (H) <0.031 ng/mL    Comment: PREVIOUS RESULT CALLED 08/07/15 AT 0754 BY DAS        PERSISTENTLY INCREASED TROPONIN VALUES IN THE RANGE OF 0.04-0.49 ng/mL CAN BE SEEN IN:       -UNSTABLE ANGINA       -CONGESTIVE HEART FAILURE       -MYOCARDITIS       -CHEST TRAUMA       -ARRYHTHMIAS       -LATE PRESENTING MYOCARDIAL INFARCTION       -COPD   CLINICAL FOLLOW-UP RECOMMENDED.   Glucose, capillary     Status: Abnormal   Collection Time: 08/07/15 11:47 AM  Result Value Ref Range   Glucose-Capillary 129 (H) 65 - 99 mg/dL  Glucose, capillary     Status: Abnormal   Collection Time: 08/07/15  4:53 PM  Result Value Ref Range   Glucose-Capillary 136 (H) 65 - 99 mg/dL   Comment 1 Notify RN   Glucose, capillary     Status: Abnormal   Collection Time: 08/07/15  9:30 PM  Result Value Ref Range   Glucose-Capillary 209 (H) 65 - 99 mg/dL  Basic metabolic panel     Status: Abnormal   Collection Time: 08/08/15  4:58 AM  Result Value Ref Range   Sodium 139 135 - 145 mmol/L   Potassium 3.7 3.5 - 5.1 mmol/L   Chloride 105 101 - 111 mmol/L   CO2 29 22 - 32 mmol/L   Glucose, Bld 184 (H) 65 - 99 mg/dL   BUN 21 (H) 6 - 20 mg/dL   Creatinine, Ser 1.12 (H) 0.44 - 1.00 mg/dL   Calcium 9.1 8.9 - 10.3 mg/dL   GFR calc non Af Amer 48 (L) >60 mL/min   GFR calc Af Amer 56 (L) >60 mL/min    Comment: (NOTE) The eGFR has been calculated using the CKD EPI equation. This calculation has not been validated in all clinical situations. eGFR's persistently <60 mL/min signify possible Chronic Kidney Disease.    Anion gap 5 5 - 15  Glucose, capillary     Status: Abnormal   Collection Time: 08/08/15  7:51 AM  Result Value Ref Range   Glucose-Capillary 163 (H) 65 - 99 mg/dL    Dg Chest Portable 1 View  08/07/2015  CLINICAL DATA:  Sudden onset of respiratory distress tonight. Similar symptoms 5 days ago prompted a visit to the ER. EXAM: PORTABLE  CHEST 1 VIEW COMPARISON:  08/02/2015 FINDINGS: Multifocal patchy alveolar opacities are present, new or worsened from 08/02/2015. This may represent alveolar edema, and there is mild vascular fullness. This could represent congestive heart failure. Infectious infiltrate less likely but not excluded. No large effusions. No pneumothorax. IMPRESSION: New central airspace opacities, likely congestive heart failure with alveolar edema. Infectious infiltrates are not excluded. Electronically Signed   By: Andreas Newport M.D.   On: 08/07/2015 01:23    Review of Systems  Constitutional: Positive for malaise/fatigue.  HENT: Positive for congestion.   Eyes: Negative.   Respiratory: Positive for shortness of breath and wheezing.   Cardiovascular: Positive for orthopnea and PND.  Gastrointestinal: Negative.   Genitourinary: Negative.   Musculoskeletal: Negative.   Skin: Negative.  Neurological: Positive for weakness.  Endo/Heme/Allergies: Negative.   Psychiatric/Behavioral: Negative.    Blood pressure 127/72, pulse 96, temperature 98.1 F (36.7 C), temperature source Oral, resp. rate 18, height 5' 4" (1.626 m), weight 98.431 kg (217 lb), SpO2 91 %. Physical Exam  Nursing note and vitals reviewed. Constitutional: She is oriented to person, place, and time. She appears well-developed and well-nourished.  HENT:  Head: Normocephalic and atraumatic.  Eyes: Conjunctivae and EOM are normal. Pupils are equal, round, and reactive to light.  Neck: Normal range of motion. Neck supple.  Cardiovascular: Regular rhythm, S1 normal, S2 normal and intact distal pulses.  Exam reveals gallop, S3 and S4.   Murmur heard.  Systolic murmur is present with a grade of 2/6  Respiratory: Effort normal.  GI: Soft. Bowel sounds are normal.  Musculoskeletal: Normal range of motion.  Neurological: She is alert and oriented to person, place, and time. She has normal reflexes.  Skin: Skin is warm.     Assessment/Plan: Congestive heart failure Respiratory failure with hypoxemia Pulmonary edema Diabetes Hypertension GERD Shortness of breath COPD Coronary artery disease Borderline troponins/demand ischemia . Plan Agree with admission to telemetry for rule out myocardial infarction Follow-up troponins and EKGs Continue supplemental oxygen Recommend continue Coreg lisinopril for heart failure as well as Lasix Aspirin therapy for 2 sclerotic vascular disease Consider statin therapy for known coronary disease hyperlipidemia Agree with insulin therapy as well as glimepiride for diabetes Continue Protonix for reflux symptoms Agree with inhalers for shortness of breath possible COPD Agree with echocardiogram for evaluation of LV function and heart failure Borderline troponins probably represent demand ischemia  CALLWOOD,DWAYNE D. 08/08/2015, 8:51 AM      

## 2015-08-08 NOTE — Progress Notes (Signed)
Patient received discharge instructions, pt verbalized understanding. IV was removed with no signs of infection. Dressing clean, dry intact. No skin tears or wounds present. Prescription was faxed to pharmacy of choice. Patient was escorted out with staff member via wheelchair via private auto. No further needs from care management team.

## 2016-01-26 ENCOUNTER — Other Ambulatory Visit: Payer: Self-pay | Admitting: Obstetrics and Gynecology

## 2016-01-26 DIAGNOSIS — Z1231 Encounter for screening mammogram for malignant neoplasm of breast: Secondary | ICD-10-CM

## 2016-05-07 ENCOUNTER — Encounter: Payer: Self-pay | Admitting: Radiology

## 2016-05-07 ENCOUNTER — Ambulatory Visit
Admission: RE | Admit: 2016-05-07 | Discharge: 2016-05-07 | Disposition: A | Discharge status: Home / Self Care | Payer: Medicare Other | Source: Ambulatory Visit | Attending: Obstetrics and Gynecology | Admitting: Obstetrics and Gynecology

## 2016-05-07 DIAGNOSIS — Z1231 Encounter for screening mammogram for malignant neoplasm of breast: Secondary | ICD-10-CM | POA: Insufficient documentation

## 2017-01-30 IMAGING — CR DG CHEST 2V
1 series · 2 of 2 positions shown · non-contrast
Comparison: Chest x-ray dated 09/20/2014.

CLINICAL DATA: Shortness of breath and heaviness to left-sided
chest, wheezing at night for approximately 3 weeks. Shortness of
breath with walking.

EXAM:
CHEST  2 VIEW

[Series 1: dg chest 2 view · 0.14mm/px · 2 of 2 slices shown]
[im 1/2]
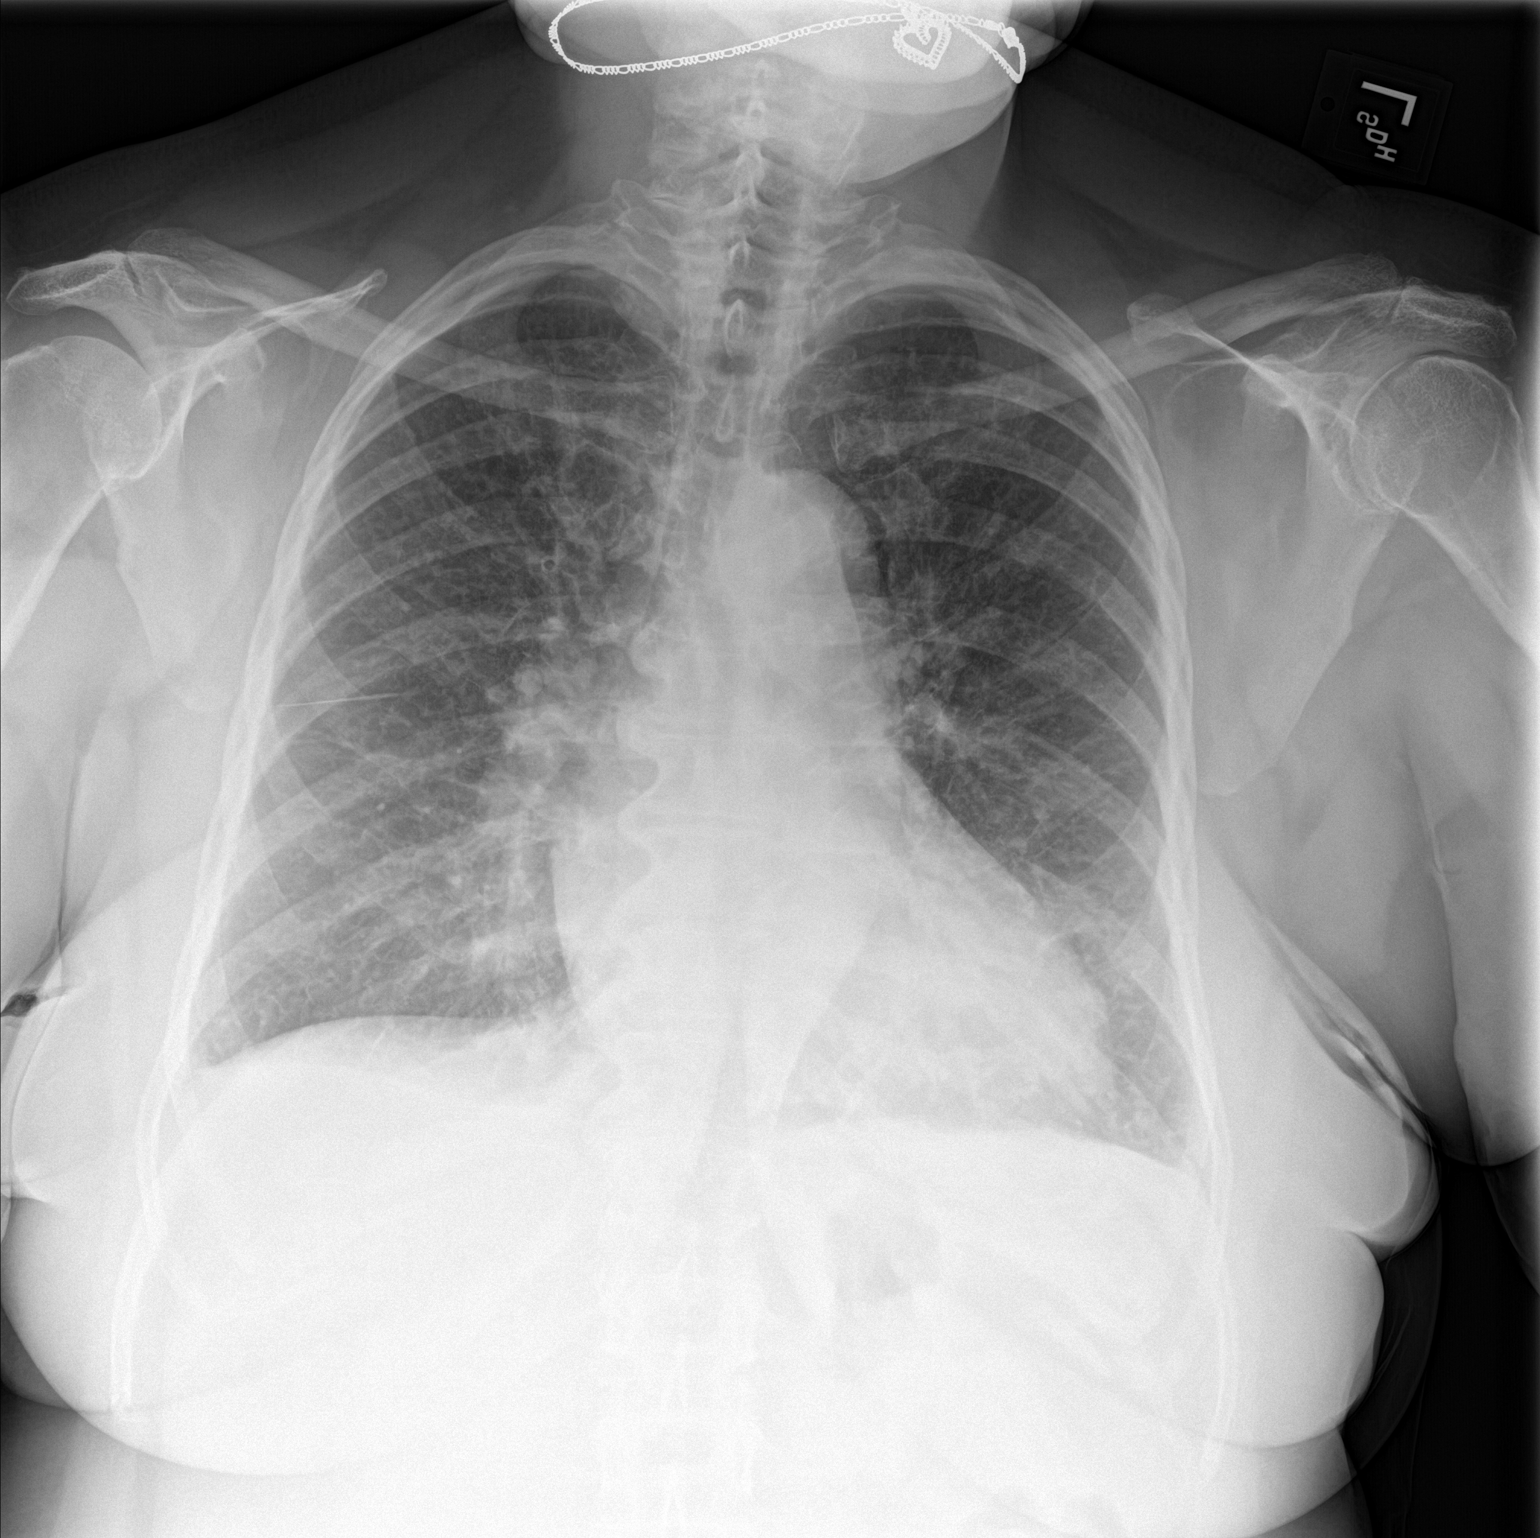
[im 2/2]
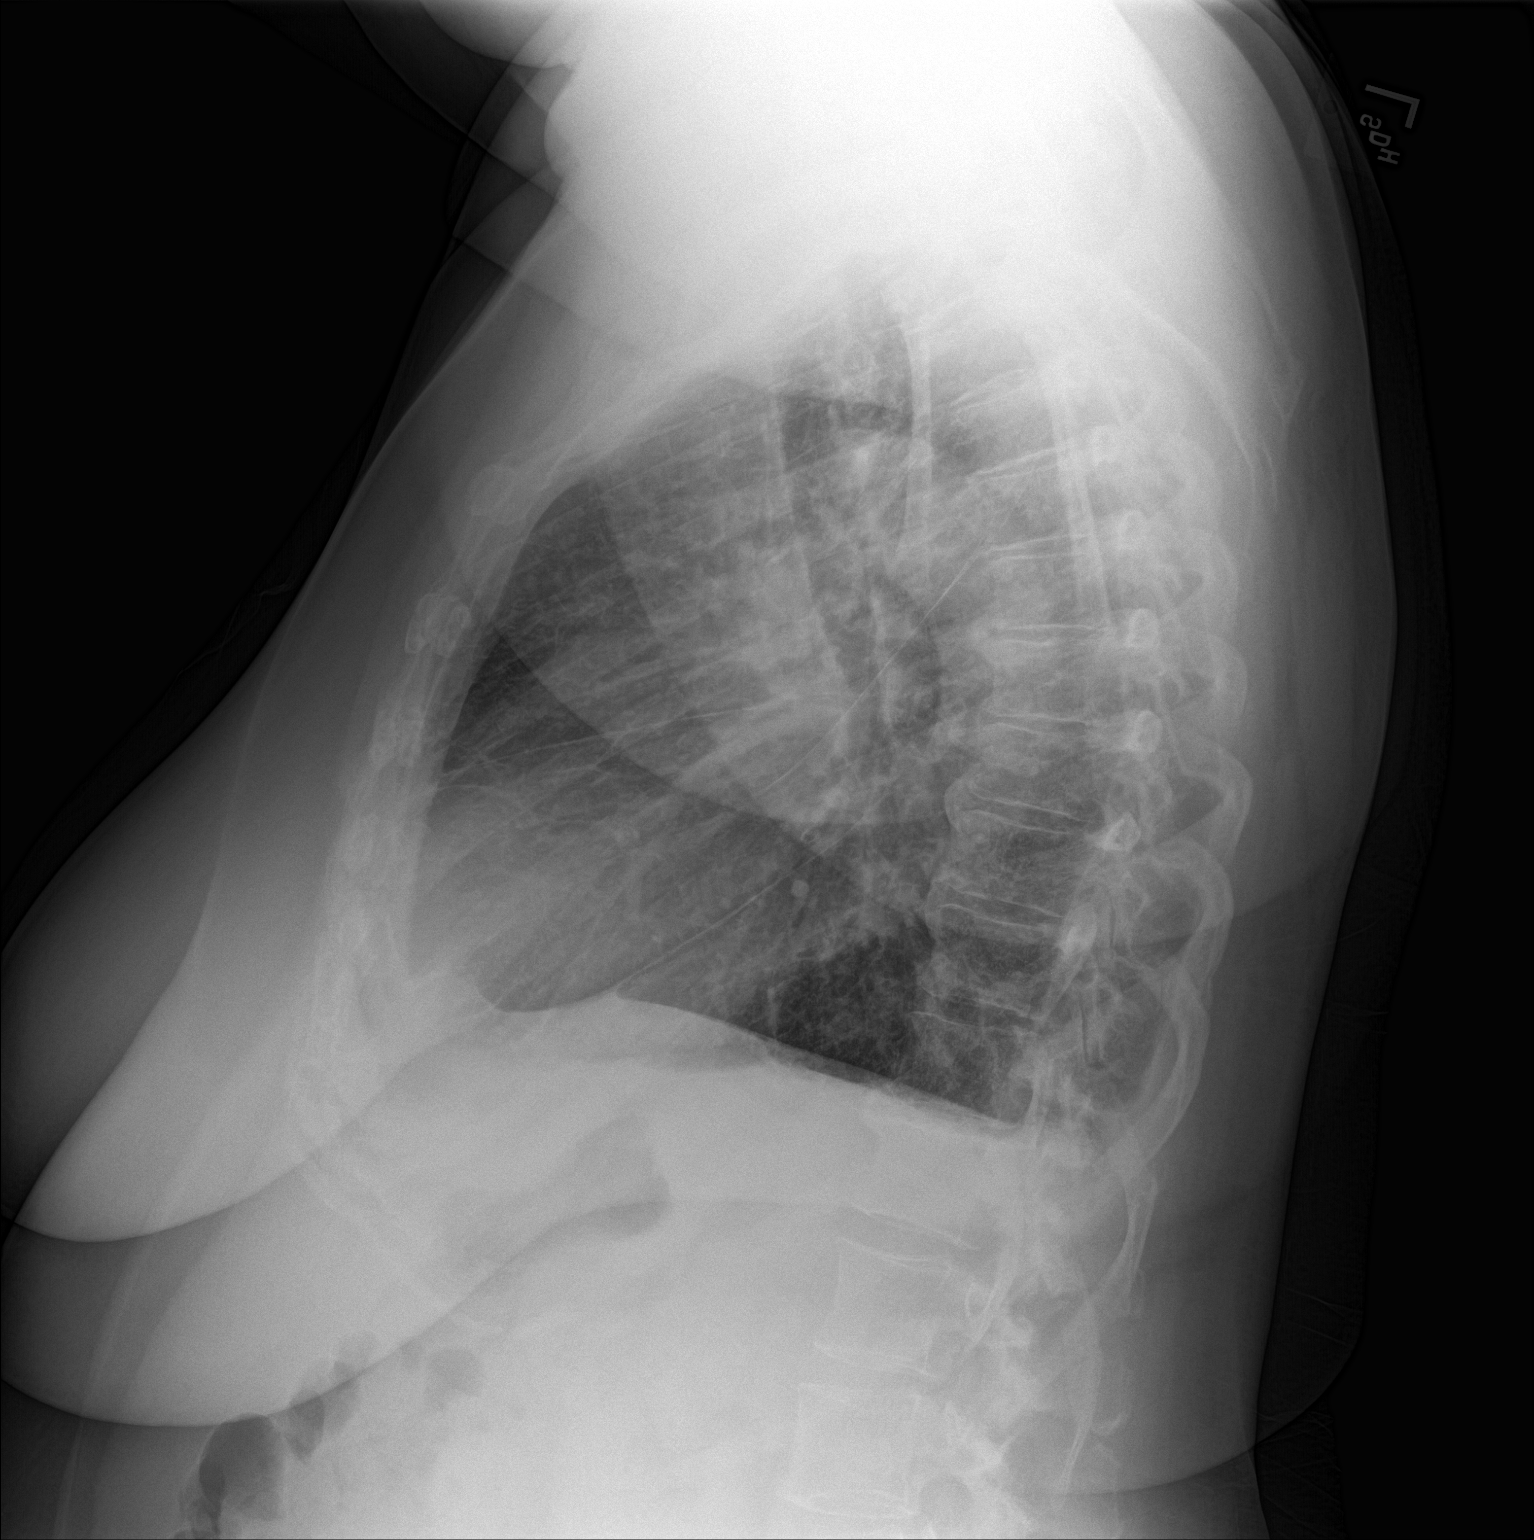

[2 of 2 positions shown; findings below may reference images not displayed]

FINDINGS: Borderline cardiomegaly is stable. Overall cardiomediastinal
silhouette is stable in size and configuration.

There is mild central pulmonary vascular congestion with
peribronchial cuffing suggesting either bronchitis or mild volume
overload. Lungs otherwise clear. No pleural effusion seen. No
pneumothorax seen. Lungs at least mildly hyperexpanded suggesting
COPD.

Osseous and soft tissue structures about the chest are unremarkable.
Mild degenerative spurring noted within the thoracic spine.
IMPRESSION: 1. Bronchitis versus mild CHF/volume overload.
2. Borderline cardiomegaly, stable.
3. Probable COPD.

## 2017-02-04 IMAGING — CR DG CHEST 1V PORT
1 series · 1 of 1 positions shown · non-contrast
Comparison: 08/02/2015

CLINICAL DATA: Sudden onset of respiratory distress tonight.
Similar symptoms 5 days ago prompted a visit to the ER.

EXAM:
PORTABLE CHEST 1 VIEW

[portable]
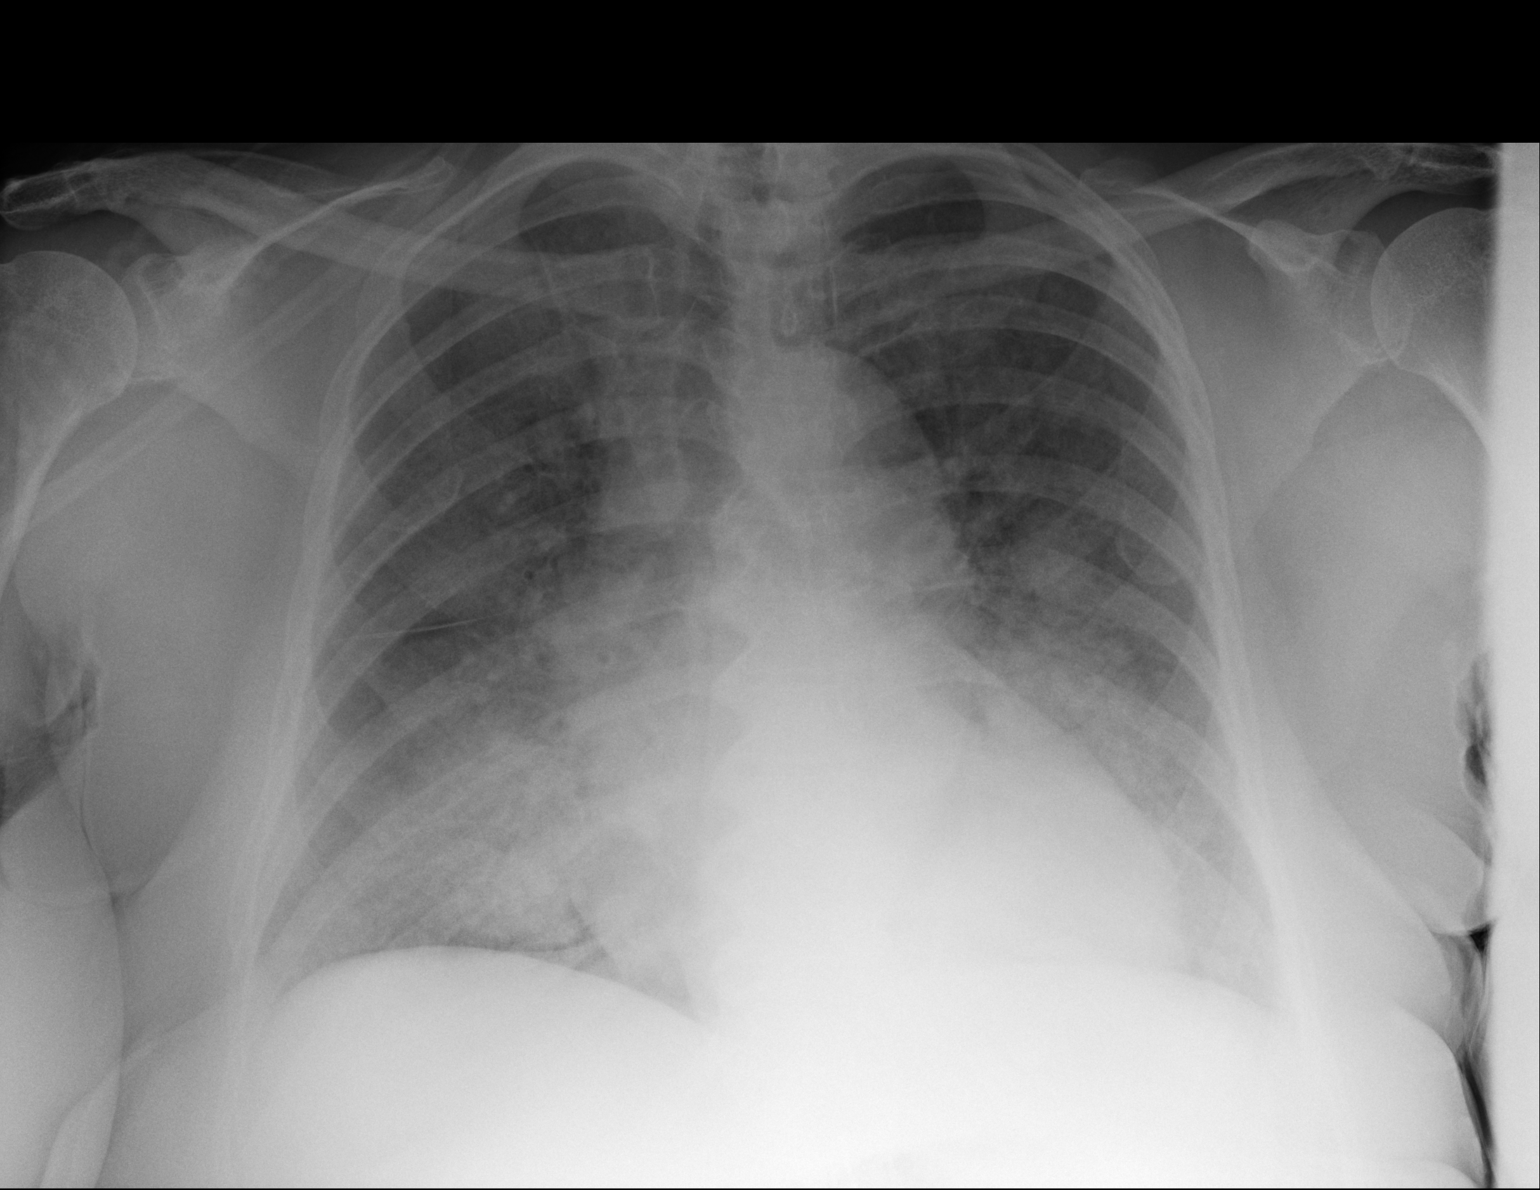

[1 of 1 positions shown; findings below may reference images not displayed]

FINDINGS: Multifocal patchy alveolar opacities are present, new or worsened
from 08/02/2015. This may represent alveolar edema, and there is
mild vascular fullness. This could represent congestive heart
failure. Infectious infiltrate less likely but not excluded. No
large effusions. No pneumothorax.
IMPRESSION: New central airspace opacities, likely congestive heart failure with
alveolar edema. Infectious infiltrates are not excluded.

## 2017-03-29 ENCOUNTER — Other Ambulatory Visit: Payer: Self-pay | Admitting: Obstetrics and Gynecology

## 2017-03-29 ENCOUNTER — Other Ambulatory Visit: Payer: Self-pay | Admitting: Internal Medicine

## 2017-03-29 DIAGNOSIS — Z1231 Encounter for screening mammogram for malignant neoplasm of breast: Secondary | ICD-10-CM

## 2017-05-09 ENCOUNTER — Ambulatory Visit
Admission: RE | Admit: 2017-05-09 | Discharge: 2017-05-09 | Disposition: A | Discharge status: Home / Self Care | Payer: Medicare Other | Source: Ambulatory Visit | Attending: Internal Medicine | Admitting: Internal Medicine

## 2017-05-09 DIAGNOSIS — Z1231 Encounter for screening mammogram for malignant neoplasm of breast: Secondary | ICD-10-CM

## 2017-11-19 ENCOUNTER — Encounter: Payer: Self-pay | Admitting: *Deleted

## 2017-11-20 ENCOUNTER — Encounter
Admission: RE | Disposition: A | Discharge status: Home / Self Care | Payer: Self-pay | Source: Ambulatory Visit | Attending: Internal Medicine

## 2017-11-20 ENCOUNTER — Ambulatory Visit
Admission: RE | Admit: 2017-11-20 | Discharge: 2017-11-20 | Disposition: A | Discharge status: Home / Self Care | Payer: Medicare Other | Source: Ambulatory Visit | Attending: Internal Medicine | Admitting: Internal Medicine

## 2017-11-20 ENCOUNTER — Encounter: Payer: Self-pay | Admitting: *Deleted

## 2017-11-20 ENCOUNTER — Ambulatory Visit: Payer: Medicare Other | Admitting: Certified Registered Nurse Anesthetist

## 2017-11-20 ENCOUNTER — Other Ambulatory Visit: Payer: Self-pay

## 2017-11-20 DIAGNOSIS — Z7984 Long term (current) use of oral hypoglycemic drugs: Secondary | ICD-10-CM | POA: Insufficient documentation

## 2017-11-20 DIAGNOSIS — Z8719 Personal history of other diseases of the digestive system: Secondary | ICD-10-CM | POA: Diagnosis not present

## 2017-11-20 DIAGNOSIS — I509 Heart failure, unspecified: Secondary | ICD-10-CM | POA: Insufficient documentation

## 2017-11-20 DIAGNOSIS — D122 Benign neoplasm of ascending colon: Secondary | ICD-10-CM | POA: Insufficient documentation

## 2017-11-20 DIAGNOSIS — I110 Hypertensive heart disease with heart failure: Secondary | ICD-10-CM | POA: Insufficient documentation

## 2017-11-20 DIAGNOSIS — I2510 Atherosclerotic heart disease of native coronary artery without angina pectoris: Secondary | ICD-10-CM | POA: Diagnosis not present

## 2017-11-20 DIAGNOSIS — Z79899 Other long term (current) drug therapy: Secondary | ICD-10-CM | POA: Diagnosis not present

## 2017-11-20 DIAGNOSIS — K640 First degree hemorrhoids: Secondary | ICD-10-CM | POA: Diagnosis not present

## 2017-11-20 DIAGNOSIS — I429 Cardiomyopathy, unspecified: Secondary | ICD-10-CM | POA: Diagnosis not present

## 2017-11-20 DIAGNOSIS — E119 Type 2 diabetes mellitus without complications: Secondary | ICD-10-CM | POA: Insufficient documentation

## 2017-11-20 DIAGNOSIS — Z1211 Encounter for screening for malignant neoplasm of colon: Secondary | ICD-10-CM | POA: Insufficient documentation

## 2017-11-20 DIAGNOSIS — K5730 Diverticulosis of large intestine without perforation or abscess without bleeding: Secondary | ICD-10-CM | POA: Insufficient documentation

## 2017-11-20 DIAGNOSIS — Z7982 Long term (current) use of aspirin: Secondary | ICD-10-CM | POA: Diagnosis not present

## 2017-11-20 DIAGNOSIS — Z86010 Personal history of colonic polyps: Secondary | ICD-10-CM | POA: Diagnosis not present

## 2017-11-20 DIAGNOSIS — E7800 Pure hypercholesterolemia, unspecified: Secondary | ICD-10-CM | POA: Insufficient documentation

## 2017-11-20 HISTORY — PX: COLONOSCOPY WITH PROPOFOL: SHX5780

## 2017-11-20 HISTORY — DX: Diverticulosis of intestine, part unspecified, without perforation or abscess without bleeding: K57.90

## 2017-11-20 HISTORY — DX: Cardiomyopathy, unspecified: I42.9

## 2017-11-20 HISTORY — DX: Polyp of colon: K63.5

## 2017-11-20 LAB — GLUCOSE, CAPILLARY: GLUCOSE-CAPILLARY: 100 mg/dL — AB (ref 70–99)

## 2017-11-20 SURGERY — COLONOSCOPY WITH PROPOFOL
Anesthesia: General

## 2017-11-20 MED ORDER — PROPOFOL 500 MG/50ML IV EMUL
INTRAVENOUS | Status: DC | PRN
  Administered 2017-11-20: 125 ug/kg/min via INTRAVENOUS

## 2017-11-20 MED ORDER — PROPOFOL 10 MG/ML IV BOLUS
INTRAVENOUS | Status: DC | PRN
  Administered 2017-11-20: 40 mg via INTRAVENOUS
  Administered 2017-11-20: 10 mg via INTRAVENOUS

## 2017-11-20 MED ORDER — PROPOFOL 500 MG/50ML IV EMUL
INTRAVENOUS | Status: AC
  Filled 2017-11-20: qty 50

## 2017-11-20 MED ORDER — PROPOFOL 10 MG/ML IV BOLUS
INTRAVENOUS | Status: AC
  Filled 2017-11-20: qty 20

## 2017-11-20 MED ORDER — SODIUM CHLORIDE 0.9 % IV SOLN
INTRAVENOUS | Status: DC
  Administered 2017-11-20: 1000 mL via INTRAVENOUS
  Administered 2017-11-20: 10:00:00 via INTRAVENOUS

## 2017-11-20 MED ORDER — LIDOCAINE HCL (CARDIAC) PF 100 MG/5ML IV SOSY
PREFILLED_SYRINGE | INTRAVENOUS | Status: DC | PRN
  Administered 2017-11-20: 50 mg via INTRAVENOUS

## 2017-11-20 NOTE — Op Note (Signed)
[facility] Patient Name: Linda Montgomery Procedure Date: 11/20/2017 9:36 AM MRN: [id number] Account #: [id number] Date of Birth: 03-21-1946 Admit Type: Outpatient Age: 72 Room: [facility] ENDO ROOM 3 Gender: Female Note Status: Finalized Procedure:            Colonoscopy Indications:          High risk colon cancer surveillance: Personal history                        of colonic polyps Providers:            Benay Pike. Alice Reichert MD, MD Referring MD:         Glendon Axe (Referring MD) Medicines:            Propofol per Anesthesia Complications:        No immediate complications. Procedure:            Pre-Anesthesia Assessment:                       - The risks and benefits of the procedure and the                        sedation options and risks were discussed with the                        patient. All questions were answered and informed                        consent was obtained.                       - Patient identification and proposed procedure were                        verified prior to the procedure by the nurse. The                        procedure was verified in the procedure room.                       - ASA Grade Assessment: II - A patient with mild                        systemic disease.                       - After reviewing the risks and benefits, the patient                        was deemed in satisfactory condition to undergo the                        procedure.                       After obtaining informed consent, the colonoscope was                        passed under direct vision. Throughout the procedure,  the patient's blood pressure, pulse, and oxygen                        saturations were monitored continuously. The                        Colonoscope was introduced through the anus and                        advanced to the the cecum, identified by appendiceal                        orifice and  ileocecal valve. The colonoscopy was                        performed without difficulty. The patient tolerated the                        procedure well. The quality of the bowel preparation                        was good. The ileocecal valve, appendiceal orifice, and                        rectum were photographed. Findings:      The perianal and digital rectal examinations were normal. Pertinent       negatives include normal sphincter tone and no palpable rectal lesions.      Many small-mouthed diverticula were found in the sigmoid colon,       descending colon and transverse colon.      A 4 mm polyp was found in the ascending colon. The polyp was sessile.       The polyp was removed with a jumbo cold forceps. Resection and retrieval       were complete.      Non-bleeding internal hemorrhoids were found during retroflexion. The       hemorrhoids were Grade I (internal hemorrhoids that do not prolapse).      The exam was otherwise without abnormality. Impression:           - Diverticulosis in the sigmoid colon, in the                        descending colon and in the transverse colon.                       - One 4 mm polyp in the ascending colon, removed with a                        jumbo cold forceps. Resected and retrieved.                       - Non-bleeding internal hemorrhoids.                       - The examination was otherwise normal. Recommendation:       - Patient has a contact number available for                        emergencies. The signs and symptoms of potential  delayed complications were discussed with the patient.                        Return to normal activities tomorrow. Written discharge                        instructions were provided to the patient.                       - Resume previous diet.                       - Continue present medications.                       - Repeat colonoscopy is recommended for surveillance.                         The colonoscopy date will be determined after pathology                        results from today's exam become available for review.                       - Return to GI clinic PRN.                       - The findings and recommendations were discussed with                        the patient and their family. Procedure Code(s):    --- Professional ---                       [phone], Colonoscopy, flexible; with biopsy, single or                        multiple Diagnosis Code(s):    --- Professional ---                       K57.30, Diverticulosis of large intestine without                        perforation or abscess without bleeding                       D12.2, Benign neoplasm of ascending colon                       K64.0, First degree hemorrhoids                       Z86.010, Personal history of colonic polyps CPT copyright 2017 [employer]. All rights reserved. The codes documented in this report are preliminary and upon coder review may  be revised to meet current compliance requirements. Efrain Sella MD, MD 11/20/2017 9:55:57 AM This report has been signed electronically. Number of Addenda: 0 Note Initiated On: 11/20/2017 9:36 AM Scope Withdrawal Time: 0 hours 6 minutes 48 seconds  Total Procedure Duration: 0 hours 10 minutes 21 seconds       [facility]

## 2017-11-20 NOTE — Anesthesia Post-op Follow-up Note (Signed)
Anesthesia QCDR form completed.        

## 2017-11-20 NOTE — H&P (Signed)
Outpatient short stay form Pre-procedure 11/20/2017 9:30 AM Teodoro K. Alice Reichert, M.D.  Primary Physician: Glendon Axe, M.D.   Reason for visit:  Patient presents for colonoscopy for polyp surveillance. The patient denies complaints of abdominal pain, significant change in bowel habits, or rectal bleeding.   History of present illness:  As above.    Current Facility-Administered Medications:  .  0.9 %  sodium chloride infusion, , Intravenous, Continuous, [place], Benay Pike, MD, Last Rate: 20 mL/hr at 11/20/17 0907, 1,000 mL at 11/20/17 0907  Medications Prior to Admission  Medication Sig Dispense Refill Last Dose  . aspirin EC 81 MG tablet Take 1 tablet by mouth daily.   Past Week at Unknown time  . atorvastatin (LIPITOR) 40 MG tablet Take 1 tablet (40 mg total) by mouth daily. 30 tablet 5 Past Week at Unknown time  . carvedilol (COREG) 6.25 MG tablet Take 1 tablet by mouth 2 (two) times daily.  0 Past Week at Unknown time  . clobetasol cream (TEMOVATE) 2.33 % Apply 1 application topically 2 (two) times daily.   Past Week at Unknown time  . enalapril (VASOTEC) 20 MG tablet Take 1 tablet by mouth daily.  0 Past Week at Unknown time  . furosemide (LASIX) 20 MG tablet Take 1 tablet (20 mg total) by mouth daily. 30 tablet 6 Past Week at Unknown time  . glimepiride (AMARYL) 2 MG tablet Take 1 tablet by mouth daily.  0 Past Week at Unknown time  . JANUVIA 100 MG tablet Take 1 tablet by mouth daily.  0 Past Week at Unknown time  . metFORMIN (GLUCOPHAGE) 500 MG tablet Take 2 tablets by mouth 2 (two) times daily.  0 Past Week at Unknown time  . omeprazole (PRILOSEC) 20 MG capsule Take 1 capsule by mouth daily.  1 Past Week at Unknown time  . pioglitazone (ACTOS) 15 MG tablet Take 15 mg by mouth daily.   Past Week at Unknown time  . polyethylene glycol-electrolytes (NULYTELY/GOLYTELY) 420 g solution Take 4,000 mLs by mouth once.   Past Week at Unknown time  . simvastatin (ZOCOR) 40 MG tablet Take 40  mg by mouth daily.   Past Week at Unknown time  . spironolactone (ALDACTONE) 25 MG tablet Take 25 mg by mouth daily.   Past Week at Unknown time     No Known Allergies   Past Medical History:  Diagnosis Date  . CAD (coronary artery disease)   . 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 CHF (congestive heart failure) ([place])   . Colon polyps   . Diabetes mellitus without complication ([place])   . Diverticulosis   . High cholesterol   . Hypercholesterolemia   . Hypertension     Review of systems:  Otherwise negative.    Physical Exam  Gen: Alert, oriented. Appears stated age.  HEENT: [place]/AT. PERRLA. Lungs: CTA, no wheezes. CV: RR nl S1, S2. Abd: soft, benign, no masses. BS+ Ext: No edema. Pulses 2+    Planned procedures: Proceed with colonoscopy. The patient understands the nature of the planned procedure, indications, risks, alternatives and potential complications including but not limited to bleeding, infection, perforation, damage to internal organs and possible oversedation/side effects from anesthesia. The patient agrees and gives consent to proceed.  Please refer to procedure notes for findings, recommendations and patient disposition/instructions.     Teodoro K. Alice Reichert, M.D. Gastroenterology 11/20/2017  9:30 AM

## 2017-11-20 NOTE — Interval H&P Note (Signed)
History and Physical Interval Note:  11/20/2017 9:31 AM  Linda Montgomery  has presented today for surgery, with the diagnosis of P/H polyps  The various methods of treatment have been discussed with the patient and family. After consideration of risks, benefits and other options for treatment, the patient has consented to  Procedure(s): COLONOSCOPY WITH PROPOFOL (N/A) as a surgical intervention .  The patient's history has been reviewed, patient examined, no change in status, stable for surgery.  I have reviewed the patient's chart and labs.  Questions were answered to the patient's satisfaction.     [place], [place]

## 2017-11-20 NOTE — Anesthesia Preprocedure Evaluation (Addendum)
Anesthesia Evaluation  Patient identified by MRN, date of birth, ID band Patient awake    Reviewed: Allergy & Precautions, H&P , NPO status , Patient's Chart, lab work & pertinent test results, reviewed documented beta blocker date and time   History of Anesthesia Complications Negative for: history of anesthetic complications  Airway Mallampati: I  TM Distance: >3 FB Neck ROM: full    Dental  (+) Dental Advidsory Given, Teeth Intact   Pulmonary neg pulmonary ROS, former smoker,           Cardiovascular Exercise Tolerance: Good hypertension, (-) angina+ CAD, + Past MI and +CHF  (-) Cardiac Stents and (-) CABG (-) dysrhythmias (-) Valvular Problems/Murmurs     Neuro/Psych negative neurological ROS  negative psych ROS   GI/Hepatic negative GI ROS, Neg liver ROS,   Endo/Other  diabetes  Renal/GU negative Renal ROS  negative genitourinary   Musculoskeletal   Abdominal   Peds  Hematology negative hematology ROS (+)   Anesthesia Other Findings Past Medical History: No date: CAD (coronary artery disease) No date: Cardiomyopathy, secondary ([place])     Comment:  NONISCHEMIC EF 35-40% No date: CHF (congestive heart failure) (HCC) No date: Colon polyps No date: Diabetes mellitus without complication (HCC) No date: Diverticulosis No date: High cholesterol No date: Hypercholesterolemia No date: Hypertension   Reproductive/Obstetrics negative OB ROS                            Anesthesia Physical Anesthesia Plan  ASA: III  Anesthesia Plan: General   Post-op Pain Management:    Induction: Intravenous  PONV Risk Score and Plan: 3 and Propofol infusion  Airway Management Planned: Nasal Cannula  Additional Equipment:   Intra-op Plan:   Post-operative Plan:   Informed Consent: I have reviewed the patients History and Physical, chart, labs and discussed the procedure including the risks,  benefits and alternatives for the proposed anesthesia with the patient or authorized representative who has indicated his/her understanding and acceptance.   Dental Advisory Given  Plan Discussed with: Anesthesiologist, CRNA and Surgeon  Anesthesia Plan Comments:         Anesthesia Quick Evaluation

## 2017-11-20 NOTE — Transfer of Care (Signed)
Immediate Anesthesia Transfer of Care Note  Patient: Linda Montgomery  Procedure(s) Performed: COLONOSCOPY WITH PROPOFOL (N/A )  Patient Location: PACU  Anesthesia Type:MAC  Level of Consciousness: awake, alert  and oriented  Airway & Oxygen Therapy: Patient Spontanous Breathing  Post-op Assessment: Report given to RN and Post -op Vital signs reviewed and stable  Post vital signs: stable  Last Vitals:  Vitals Value Taken Time  BP 106/52 11/20/2017  9:56 AM  Temp    Pulse 76 11/20/2017  9:56 AM  Resp 20 11/20/2017  9:56 AM  SpO2 99 % 11/20/2017  9:56 AM  Vitals shown include unvalidated device data.  Last Pain:  Vitals:   11/20/17 0836  TempSrc: Tympanic  PainSc: 0-No pain         Complications: No apparent anesthesia complications

## 2017-11-21 NOTE — Anesthesia Postprocedure Evaluation (Signed)
Anesthesia Post Note  Patient: Linda Montgomery  Procedure(s) Performed: COLONOSCOPY WITH PROPOFOL (N/A )  Patient location during evaluation: Endoscopy Anesthesia Type: General Level of consciousness: awake and alert Pain management: pain level controlled Vital Signs Assessment: post-procedure vital signs reviewed and stable Respiratory status: spontaneous breathing, nonlabored ventilation, respiratory function stable and patient connected to nasal cannula oxygen Cardiovascular status: blood pressure returned to baseline and stable Postop Assessment: no apparent nausea or vomiting Anesthetic complications: no     Last Vitals:  Vitals:   11/20/17 0956 11/20/17 1006  BP: (!) 106/52   Pulse:    Resp: 20 15  Temp: (!) 36.1 C   SpO2:      Last Pain:  Vitals:   11/21/17 0907  TempSrc:   PainSc: 0-No pain                 Martha Clan

## 2017-11-22 LAB — SURGICAL PATHOLOGY

## 2017-11-25 ENCOUNTER — Encounter: Payer: Self-pay | Admitting: Internal Medicine

## 2018-03-24 ENCOUNTER — Other Ambulatory Visit: Payer: Self-pay | Admitting: Internal Medicine

## 2018-03-24 DIAGNOSIS — Z1231 Encounter for screening mammogram for malignant neoplasm of breast: Secondary | ICD-10-CM

## 2018-03-30 ENCOUNTER — Other Ambulatory Visit: Payer: Self-pay

## 2018-03-30 ENCOUNTER — Encounter: Payer: Self-pay | Admitting: *Deleted

## 2018-03-30 ENCOUNTER — Emergency Department
Admission: EM | Admit: 2018-03-30 | Discharge: 2018-03-30 | Disposition: A | Discharge status: Home / Self Care | Payer: Medicare Other | Attending: Emergency Medicine | Admitting: Emergency Medicine

## 2018-03-30 DIAGNOSIS — I110 Hypertensive heart disease with heart failure: Secondary | ICD-10-CM | POA: Insufficient documentation

## 2018-03-30 DIAGNOSIS — Z7982 Long term (current) use of aspirin: Secondary | ICD-10-CM | POA: Insufficient documentation

## 2018-03-30 DIAGNOSIS — Z7984 Long term (current) use of oral hypoglycemic drugs: Secondary | ICD-10-CM | POA: Insufficient documentation

## 2018-03-30 DIAGNOSIS — I509 Heart failure, unspecified: Secondary | ICD-10-CM | POA: Diagnosis not present

## 2018-03-30 DIAGNOSIS — I10 Essential (primary) hypertension: Secondary | ICD-10-CM

## 2018-03-30 DIAGNOSIS — I2510 Atherosclerotic heart disease of native coronary artery without angina pectoris: Secondary | ICD-10-CM | POA: Diagnosis not present

## 2018-03-30 DIAGNOSIS — Z87891 Personal history of nicotine dependence: Secondary | ICD-10-CM | POA: Insufficient documentation

## 2018-03-30 DIAGNOSIS — Z79899 Other long term (current) drug therapy: Secondary | ICD-10-CM | POA: Diagnosis not present

## 2018-03-30 DIAGNOSIS — E119 Type 2 diabetes mellitus without complications: Secondary | ICD-10-CM | POA: Diagnosis not present

## 2018-03-30 LAB — TROPONIN I

## 2018-03-30 LAB — BASIC METABOLIC PANEL
Anion gap: 11 (ref 5–15)
BUN: 10 mg/dL (ref 8–23)
CALCIUM: 9.1 mg/dL (ref 8.9–10.3)
CHLORIDE: 106 mmol/L (ref 98–111)
CO2: 24 mmol/L (ref 22–32)
CREATININE: 0.83 mg/dL (ref 0.44–1.00)
GFR calc non Af Amer: >60 mL/min (ref >60)
GLUCOSE: 136 mg/dL — AB (ref 70–99)
Potassium: 3.7 mmol/L (ref 3.5–5.1)
Sodium: 141 mmol/L (ref 135–145)

## 2018-03-30 LAB — CBC
HEMATOCRIT: 43.3 % (ref 36.0–46.0)
HEMOGLOBIN: 13.6 g/dL (ref 12.0–15.0)
MCH: 27.9 pg (ref 26.0–34.0)
MCHC: 31.4 g/dL (ref 30.0–36.0)
MCV: 88.9 fL (ref 80.0–100.0)
Platelets: 265 10*3/uL (ref 150–400)
RBC: 4.87 MIL/uL (ref 3.87–5.11)
RDW: 14.6 % (ref 11.5–15.5)
WBC: 5.6 10*3/uL (ref 4.0–10.5)
nRBC: 0 % (ref 0.0–0.2)

## 2018-03-30 MED ORDER — ACETAMINOPHEN 500 MG PO TABS
1000.0000 mg | ORAL_TABLET | Freq: Once | ORAL | Status: DC
  Filled 2018-03-30: qty 2

## 2018-03-30 MED ORDER — CLONIDINE HCL 0.1 MG PO TABS
0.1000 mg | ORAL_TABLET | Freq: Once | ORAL | Status: AC
  Administered 2018-03-30: 0.1 mg via ORAL
  Filled 2018-03-30: qty 1

## 2018-03-30 MED ORDER — CLONIDINE HCL 0.1 MG PO TABS: 0.1000 mg | ORAL_TABLET | Freq: Every day | ORAL | 0 refills | Status: AC | PRN

## 2018-03-30 NOTE — ED Triage Notes (Signed)
First Nurse Note:  C/O blood pressure being elevated for the past few days.  C/O intermittent headaches and intermittent numbness and tingling to fingers at night.  Denies current symptoms, just wanting to get blood pressure checked.  AAOx3.  Skin warm and dry. NAD

## 2018-03-30 NOTE — ED Provider Notes (Signed)
[facility] Emergency Department Provider Note  Time seen: 12:32 PM  I have reviewed the triage vital signs and the nursing notes.   HISTORY  Chief Complaint Hypertension    HPI Linda Montgomery is a 72 y.o. female with a past medical history of CAD, cardiomyopathy, diabetes, hypertension, hyperlipidemia presents to the emergency department for hypertension.  According to the patient over the past 4 or 5 days her blood pressures been running into the 180s and 190s.  Patient denies any other symptoms besides a mild headache and occasionally will have tingling in her hands at night.  Denies any weakness or numbness confusion or slurred speech.  Denies any chest pain or trouble breathing no abdominal pain vomiting.  Overall patient appears extremely well during my evaluation.   Past Medical History:  Diagnosis Date  . CAD (coronary artery disease)   . Cardiomyopathy, secondary ([place])    NONISCHEMIC EF 20%  . CHF (congestive heart failure) ([place])   . Colon polyps   . Diabetes mellitus without complication ([place])   . Diverticulosis   . High cholesterol   . Hypercholesterolemia   . Hypertension     Patient Active Problem List   Diagnosis Date Noted  . Elevated troponin level 08/08/2015  . Acute respiratory failure with hypoxia  08/07/2015  . Acute pulmonary edema with congestive heart failure ([place]) 08/07/2015  . Diabetes mellitus type 2, uncomplicated  76/19/5093    Past Surgical History:  Procedure Laterality Date  . ABDOMINAL HYSTERECTOMY     W/PARTIAL VAGINACTOMY  . COLONOSCOPY    . COLONOSCOPY WITH PROPOFOL N/A 11/20/2017   Procedure: COLONOSCOPY WITH PROPOFOL;  Surgeon: Toledo, Benay Pike, MD;  Location: [facility] ENDOSCOPY;  Service: Gastroenterology;  Laterality: N/A;    Prior to Admission medications   Medication Sig Start Date End Date Taking? Authorizing Provider  aspirin EC 81 MG tablet Take 1 tablet by mouth daily.    [provider]   atorvastatin (LIPITOR) 40 MG tablet Take 1 tablet (40 mg total) by mouth daily. 08/08/15   Theodoro Grist, MD  carvedilol (COREG) 6.25 MG tablet Take 1 tablet by mouth 2 (two) times daily. 07/21/15   [provider]  clobetasol cream (TEMOVATE) 2.67 % Apply 1 application topically 2 (two) times daily.    [provider]  enalapril (VASOTEC) 20 MG tablet Take 1 tablet by mouth daily. 07/27/15   [provider]  furosemide (LASIX) 20 MG tablet Take 1 tablet (20 mg total) by mouth daily. 08/08/15   Theodoro Grist, MD  glimepiride (AMARYL) 2 MG tablet Take 1 tablet by mouth daily. 07/21/15   [provider]  JANUVIA 100 MG tablet Take 1 tablet by mouth daily. 07/21/15   [provider]  metFORMIN (GLUCOPHAGE) 500 MG tablet Take 2 tablets by mouth 2 (two) times daily. 07/11/15   [provider]  omeprazole (PRILOSEC) 20 MG capsule Take 1 capsule by mouth daily. 08/02/15   [provider]  pioglitazone (ACTOS) 15 MG tablet Take 15 mg by mouth daily.    [provider]  polyethylene glycol-electrolytes (NULYTELY/GOLYTELY) 420 g solution Take 4,000 mLs by mouth once.    [provider]  simvastatin (ZOCOR) 40 MG tablet Take 40 mg by mouth daily.    [provider]  spironolactone (ALDACTONE) 25 MG tablet Take 25 mg by mouth daily.    [provider]    No Known Allergies  Family History  Problem Relation Age of Onset  .  Breast cancer Neg Hx     Social History Social History   Tobacco Use  . Smoking status: Former Research scientist (life sciences)  . Smokeless tobacco: Never Used  Substance Use Topics  . Alcohol use: Yes    Comment: rarely  . Drug use: No    Review of Systems Constitutional: Negative for fever. Cardiovascular: Negative for chest pain. Respiratory: Negative for shortness of breath. Gastrointestinal: Negative for abdominal pain Musculoskeletal: Negative for leg pain or swelling. Skin: Negative for skin  complaints  Neurological: Mild headache, intermittent tingling in her hands bilaterally at night usually. All other ROS negative  ____________________________________________   PHYSICAL EXAM:  VITAL SIGNS: ED Triage Vitals  Enc Vitals Group     BP 03/30/18 1145 (!) 187/85     Pulse Rate 03/30/18 1145 93     Resp 03/30/18 1145 16     Temp 03/30/18 1145 97.8 F (36.6 C)     Temp Source 03/30/18 1145 Oral     SpO2 03/30/18 1145 99 %     Weight 03/30/18 1147 227 lb (103 kg)     Height 03/30/18 1147 5\' 4"  (1.626 m)     Head Circumference --      Peak Flow --      Pain Score 03/30/18 1147 0     Pain Loc --      Pain Edu? --      Excl. in [place]? --    Constitutional: Alert and oriented. Well appearing and in no distress. Eyes: Normal exam ENT   Head: Normocephalic and atraumatic.   Mouth/Throat: Mucous membranes are moist. Cardiovascular: Normal rate, regular rhythm. No murmur Respiratory: Normal respiratory effort without tachypnea nor retractions. Breath sounds are clear Gastrointestinal: Soft and nontender. No distention.  Musculoskeletal: Nontender with normal range of motion in all extremities.  Neurologic:  Normal speech and language. No gross focal neurologic deficits  Skin:  Skin is warm, dry and intact.  Psychiatric: Mood and affect are normal.   ____________________________________________    EKG  EKG reviewed and interpreted by myself shows a normal sinus rhythm at 81 bpm with a narrow QRS, normal axis, normal intervals, nonspecific ST changes.  ____________________________________________   INITIAL IMPRESSION / ASSESSMENT AND PLAN / ED COURSE  Pertinent labs & imaging results that were available during my care of the patient were reviewed by me and considered in my medical decision making (see chart for details).  Patient presents to the emergency department for high blood pressure over the past for 5 days.  Currently 187/85 in the emergency department.   Patient denies any chest pain or trouble breathing.  States intermittent mild headache which she associates to the high blood pressure.  Denies any weakness or numbness confusion or slurred speech.  Does state occasionally tingling in her fingertips at nighttime on both sides.  Denies any currently.  I reviewed the patient's notes she sees Dr. Candiss Norse for her primary care doctor and Dr. Ubaldo Glassing for cardiology.  Currently takes Coreg and enalapril, as well as furosemide.  We will dose clonidine 0.1 mg in the emergency department check labs and EKG continue to closely monitor.  Patient agreeable to plan of care.  Patient's work-up is essentially negative.  Patient is feeling much better, blood pressure is currently 782 systolic.  We will discharge with a prescription for clonidine to be used as needed for systolic blood pressure greater than 180.  Patient agreeable and will follow up with her doctor. ____________________________________________   FINAL CLINICAL IMPRESSION(S) /  ED DIAGNOSES  Hypertension Headache    Harvest Dark, MD 03/30/18 1303

## 2018-03-30 NOTE — ED Triage Notes (Addendum)
Pt reporting elevated BP for the past 3 days with burning in her eyes. No headache or changes in vision reported. Numbness in both hands but no other neuro deficits noted in triage. Pt has been taking her medications as prescribed.   Pt recently had a vaginal biopsy on Thursday and reports she has had increased stress while waiting for results. Pt calm in triage. No CP or SOB

## 2018-03-30 NOTE — ED Notes (Signed)
Pt c/o high blood pressure for the past few days. Pt has also had headache but does not currently have a headache at this time. Pt reports bilateral hand numbness. Pt denies chest pain at this time.

## 2018-05-12 ENCOUNTER — Ambulatory Visit
Admission: RE | Admit: 2018-05-12 | Discharge: 2018-05-12 | Disposition: A | Discharge status: Home / Self Care | Payer: Medicare Other | Source: Ambulatory Visit | Attending: Internal Medicine | Admitting: Internal Medicine

## 2018-05-12 DIAGNOSIS — Z1231 Encounter for screening mammogram for malignant neoplasm of breast: Secondary | ICD-10-CM | POA: Diagnosis not present

## 2018-05-25 ENCOUNTER — Emergency Department
Admission: EM | Admit: 2018-05-25 | Discharge: 2018-05-25 | Disposition: A | Discharge status: Home / Self Care | Payer: Medicare Other | Attending: Emergency Medicine | Admitting: Emergency Medicine

## 2018-05-25 ENCOUNTER — Other Ambulatory Visit: Payer: Self-pay

## 2018-05-25 DIAGNOSIS — Z79899 Other long term (current) drug therapy: Secondary | ICD-10-CM | POA: Insufficient documentation

## 2018-05-25 DIAGNOSIS — I509 Heart failure, unspecified: Secondary | ICD-10-CM | POA: Diagnosis not present

## 2018-05-25 DIAGNOSIS — Z7982 Long term (current) use of aspirin: Secondary | ICD-10-CM | POA: Insufficient documentation

## 2018-05-25 DIAGNOSIS — Z7984 Long term (current) use of oral hypoglycemic drugs: Secondary | ICD-10-CM | POA: Diagnosis not present

## 2018-05-25 DIAGNOSIS — M791 Myalgia, unspecified site: Secondary | ICD-10-CM | POA: Diagnosis present

## 2018-05-25 DIAGNOSIS — I2510 Atherosclerotic heart disease of native coronary artery without angina pectoris: Secondary | ICD-10-CM | POA: Diagnosis not present

## 2018-05-25 DIAGNOSIS — J101 Influenza due to other identified influenza virus with other respiratory manifestations: Secondary | ICD-10-CM | POA: Diagnosis not present

## 2018-05-25 DIAGNOSIS — Z87891 Personal history of nicotine dependence: Secondary | ICD-10-CM | POA: Insufficient documentation

## 2018-05-25 DIAGNOSIS — E119 Type 2 diabetes mellitus without complications: Secondary | ICD-10-CM | POA: Diagnosis not present

## 2018-05-25 DIAGNOSIS — I110 Hypertensive heart disease with heart failure: Secondary | ICD-10-CM | POA: Insufficient documentation

## 2018-05-25 LAB — INFLUENZA PANEL BY PCR (TYPE A & B)
Influenza A By PCR: POSITIVE — AB
Influenza B By PCR: NEGATIVE

## 2018-05-25 MED ORDER — OSELTAMIVIR PHOSPHATE 75 MG PO CAPS: 75.0000 mg | ORAL_CAPSULE | Freq: Two times a day (BID) | ORAL | 0 refills | Status: AC

## 2018-05-25 MED ORDER — BENZONATATE 100 MG PO CAPS: ORAL_CAPSULE | ORAL | 0 refills | Status: AC

## 2018-05-25 NOTE — ED Provider Notes (Signed)
[facility] Emergency Department Provider Note  ____________________________________________   First MD Initiated Contact with Patient 05/25/18 1439     (approximate)  I have reviewed the triage vital signs and the nursing notes.   HISTORY  Chief Complaint Cough   HPI Linda Montgomery is a 73 y.o. female presents to the ED with complaint of cough, muscle aches and sweating.  Patient states that symptoms were sudden.  She has been with her great granddaughter who currently has the flu.  Patient has taken over-the-counter medication for muscle aches.  She states cough is worse at night.   Past Medical History:  Diagnosis Date  . CAD (coronary artery disease)   . Cardiomyopathy, secondary ([place])    NONISCHEMIC EF 20%  . CHF (congestive heart failure) ([place])   . Colon polyps   . Diabetes mellitus without complication ([place])   . Diverticulosis   . High cholesterol   . Hypercholesterolemia   . Hypertension     Patient Active Problem List   Diagnosis Date Noted  . Elevated troponin level 08/08/2015  . Acute respiratory failure with hypoxia  08/07/2015  . Acute pulmonary edema with congestive heart failure ([place]) 08/07/2015  . Diabetes mellitus type 2, uncomplicated  93/26/7124    Past Surgical History:  Procedure Laterality Date  . ABDOMINAL HYSTERECTOMY     W/PARTIAL VAGINACTOMY  . COLONOSCOPY    . COLONOSCOPY WITH PROPOFOL N/A 11/20/2017   Procedure: COLONOSCOPY WITH PROPOFOL;  Surgeon: Toledo, Benay Pike, MD;  Location: [facility] ENDOSCOPY;  Service: Gastroenterology;  Laterality: N/A;    Prior to Admission medications   Medication Sig Start Date End Date Taking? Authorizing Provider  aspirin EC 81 MG tablet Take 1 tablet by mouth daily.    [provider]  atorvastatin (LIPITOR) 40 MG tablet Take 1 tablet (40 mg total) by mouth daily. 08/08/15   Theodoro Grist, MD  benzonatate (TESSALON PERLES) 100 MG capsule Take 1 or 2 every 8 hours as needed  for coughing. 05/25/18   Johnn Hai, PA-C  carvedilol (COREG) 6.25 MG tablet Take 1 tablet by mouth 2 (two) times daily. 07/21/15   [provider]  clobetasol cream (TEMOVATE) 5.80 % Apply 1 application topically 2 (two) times daily.    [provider]  cloNIDine (CATAPRES) 0.1 MG tablet Take 1 tablet (0.1 mg total) by mouth daily as needed (Systolic blood pressure >998). 03/30/18   Harvest Dark, MD  enalapril (VASOTEC) 20 MG tablet Take 1 tablet by mouth daily. 07/27/15   [provider]  furosemide (LASIX) 20 MG tablet Take 1 tablet (20 mg total) by mouth daily. 08/08/15   Theodoro Grist, MD  glimepiride (AMARYL) 2 MG tablet Take 1 tablet by mouth daily. 07/21/15   [provider]  JANUVIA 100 MG tablet Take 1 tablet by mouth daily. 07/21/15   [provider]  metFORMIN (GLUCOPHAGE) 500 MG tablet Take 2 tablets by mouth 2 (two) times daily. 07/11/15   [provider]  omeprazole (PRILOSEC) 20 MG capsule Take 1 capsule by mouth daily. 08/02/15   [provider]  oseltamivir (TAMIFLU) 75 MG capsule Take 1 capsule (75 mg total) by mouth 2 (two) times daily for 5 days. 05/25/18 05/30/18  Letitia Neri L, PA-C  pioglitazone (ACTOS) 15 MG tablet Take 15 mg by mouth daily.    [provider]  polyethylene glycol-electrolytes (NULYTELY/GOLYTELY) 420 g solution Take 4,000 mLs by mouth once.    [provider]  simvastatin (  ZOCOR) 40 MG tablet Take 40 mg by mouth daily.    [provider]  spironolactone (ALDACTONE) 25 MG tablet Take 25 mg by mouth daily.    [provider]    Allergies Patient has no known allergies.  Family History  Problem Relation Age of Onset  . Breast cancer Neg Hx     Social History Social History   Tobacco Use  . Smoking status: Former Research scientist (life sciences)  . Smokeless tobacco: Never Used  Substance Use Topics  . Alcohol use: Yes    Comment: rarely  . Drug use: No    Review of  Systems Constitutional: No fever/chills Eyes: No visual changes. ENT: Positive nasal congestion. Cardiovascular: Denies chest pain. Respiratory: Denies shortness of breath.  Positive for coughing. Gastrointestinal: No abdominal pain.  No nausea, no vomiting.  No diarrhea.   Genitourinary: Negative for dysuria. Musculoskeletal: Positive for body aches. Skin: Negative for rash. Neurological: Positive for headache, negative for focal weakness or numbness. ___________________________________________   PHYSICAL EXAM:  VITAL SIGNS: ED Triage Vitals [05/25/18 1312]  Enc Vitals Group     BP (!) 142/74     Pulse Rate 83     Resp (!) 22     Temp 98.7 F (37.1 C)     Temp Source Oral     SpO2 98 %     Weight 230 lb (104.3 kg)     Height 5\' 4"  (1.626 m)     Head Circumference      Peak Flow      Pain Score 0     Pain Loc      Pain Edu?      Excl. in [place]?    Constitutional: Alert and oriented. Well appearing and in no acute distress. Eyes: Conjunctivae are normal. PERRL. EOMI. Head: Atraumatic. Nose: Mild congestion/rhinnorhea. Mouth/Throat: Mucous membranes are moist.  Oropharynx non-erythematous. Neck: No stridor.   Hematological/Lymphatic/Immunilogical: No cervical lymphadenopathy. Cardiovascular: Normal rate, regular rhythm. Grossly normal heart sounds.  Good peripheral circulation. Respiratory: Normal respiratory effort.  No retractions. Lungs CTAB. Musculoskeletal: No lower extremity tenderness nor edema.  No joint effusions. Neurologic:  Normal speech and language. No gross focal neurologic deficits are appreciated. No gait instability. Skin:  Skin is warm, dry and intact. No rash noted. Psychiatric: Mood and affect are normal. Speech and behavior are normal.  ____________________________________________   LABS (all labs ordered are listed, but only abnormal results are displayed)  Labs Reviewed  INFLUENZA PANEL BY PCR (TYPE A & B) - Abnormal; Notable for the following  components:      Result Value   Influenza A By PCR POSITIVE (*)    All other components within normal limits    PROCEDURES  Procedure(s) performed: None  Procedures  Critical Care performed: No  ____________________________________________   INITIAL IMPRESSION / ASSESSMENT AND PLAN / ED COURSE  As part of my medical decision making, I reviewed the following data within the electronic MEDICAL RECORD NUMBER Notes from prior ED visits and [place] Controlled Substance Database  Patient presents to the ED with sudden onset of body aches, cough and not feeling well.  She has been exposed to the flu by her great granddaughter.  It was shortly after seeing her that she began having symptoms as well.  Influenza test was positive for type A.  Patient was made aware.  She will continue with Tylenol as needed for fever and body aches.  She will begin Tamiflu twice a day for the next  5 days and Tessalon Perles was written for her to take 1 or 2 every 8 hours as needed for cough especially at night.  She is to follow-up with her PCP if any continued problems and return to the emergency department if any worsening of her symptoms. ____________________________________________   FINAL CLINICAL IMPRESSION(S) / ED DIAGNOSES  Final diagnoses:  Influenza A     ED Discharge Orders         Ordered    oseltamivir (TAMIFLU) 75 MG capsule  2 times daily     05/25/18 1452    benzonatate (TESSALON PERLES) 100 MG capsule     05/25/18 1452           Note:  This document was prepared using Dragon voice recognition software and may include unintentional dictation errors.    Johnn Hai, PA-C 05/25/18 1550    Harvest Dark, MD 05/26/18 1428

## 2018-05-25 NOTE — ED Notes (Signed)
Pt states flu-like sxs began on Friday, pt states her grandson and great-granddaughter all have recently had the flu, pt states she did get a flu shot a few months ago.  Pt reports cough that is productive of white sputum. Cough present in room, Pt states she does not smoke, no hx of COPD or asthma.

## 2018-05-25 NOTE — Discharge Instructions (Signed)
Follow-up with your primary care provider if any continued problems.  A prescription for Tamiflu has been sent to your pharmacy which is for the flu.  Begin taking 1 tablet twice a day for the next 5 days.  Also Tessalon Perles 1 or 2 every 8 hours as needed for coughing.  You may take Tylenol or ibuprofen if needed for fever or body aches.  Drink fluids frequently to stay hydrated.  Return to the emergency department if any severe worsening of your symptoms.

## 2018-05-25 NOTE — ED Triage Notes (Signed)
Pt arrived via POV with cough, muscle aches, and night sweats. Pt reports that her great granddaughter had the flu and she thinks that she may have it as well. Pt NAD at present.

## 2019-03-16 ENCOUNTER — Other Ambulatory Visit: Payer: Self-pay

## 2019-03-16 ENCOUNTER — Other Ambulatory Visit: Payer: Self-pay | Admitting: Internal Medicine

## 2019-03-16 DIAGNOSIS — Z20822 Contact with and (suspected) exposure to covid-19: Secondary | ICD-10-CM

## 2019-03-16 DIAGNOSIS — Z1231 Encounter for screening mammogram for malignant neoplasm of breast: Secondary | ICD-10-CM

## 2019-03-17 LAB — NOVEL CORONAVIRUS, NAA: SARS-CoV-2, NAA: NOT DETECTED

## 2019-04-23 ENCOUNTER — Other Ambulatory Visit: Payer: Self-pay

## 2019-04-23 DIAGNOSIS — Z20822 Contact with and (suspected) exposure to covid-19: Secondary | ICD-10-CM

## 2019-04-25 LAB — NOVEL CORONAVIRUS, NAA: SARS-CoV-2, NAA: NOT DETECTED

## 2019-05-18 ENCOUNTER — Ambulatory Visit
Admission: RE | Admit: 2019-05-18 | Discharge: 2019-05-18 | Disposition: A | Discharge status: Home / Self Care | Payer: Medicare Other | Source: Ambulatory Visit | Attending: Internal Medicine | Admitting: Internal Medicine

## 2019-05-18 DIAGNOSIS — Z1231 Encounter for screening mammogram for malignant neoplasm of breast: Secondary | ICD-10-CM | POA: Diagnosis not present

## 2019-11-10 IMAGING — MG DIGITAL SCREENING BILATERAL MAMMOGRAM WITH TOMO AND CAD
8 series · 8 of 24 positions shown · non-contrast
Comparison: Previous exam(s).

CLINICAL DATA: Screening.

EXAM:
DIGITAL SCREENING BILATERAL MAMMOGRAM WITH TOMO AND CAD

[R CC synth-2D]
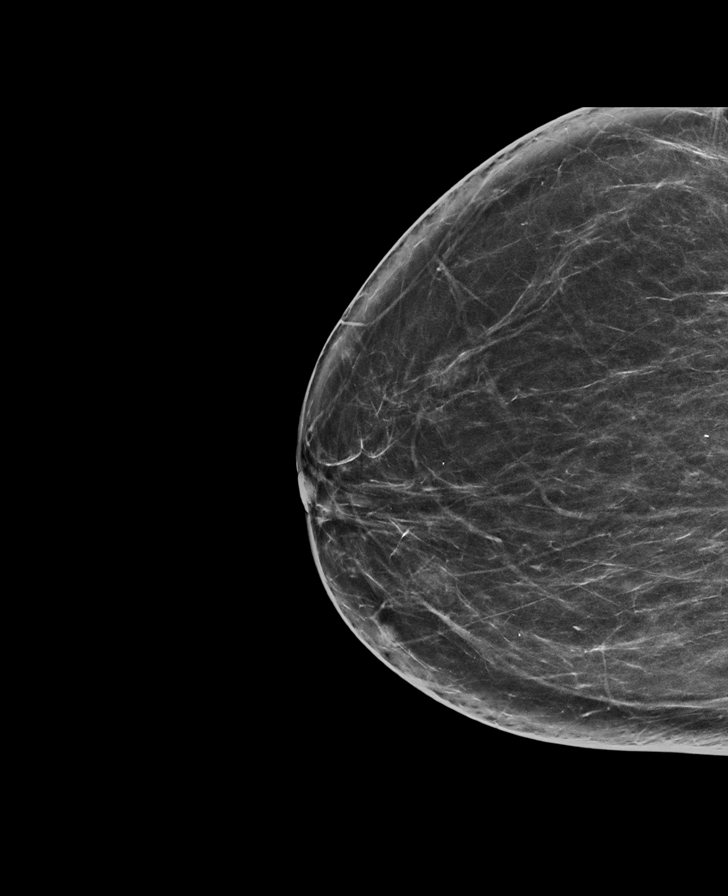

[L CC synth-2D]
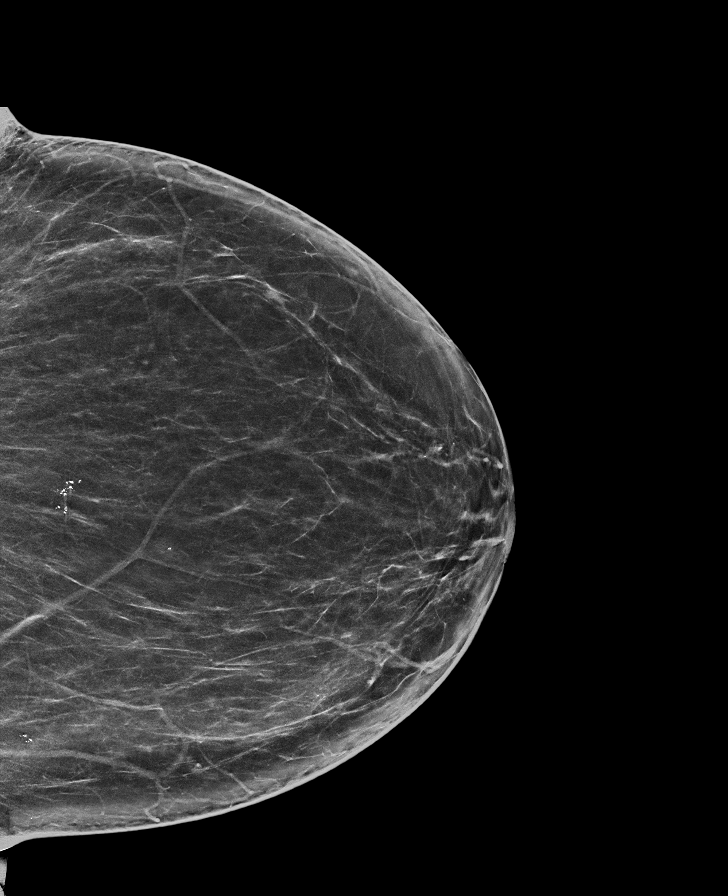

[R MLO synth-2D]
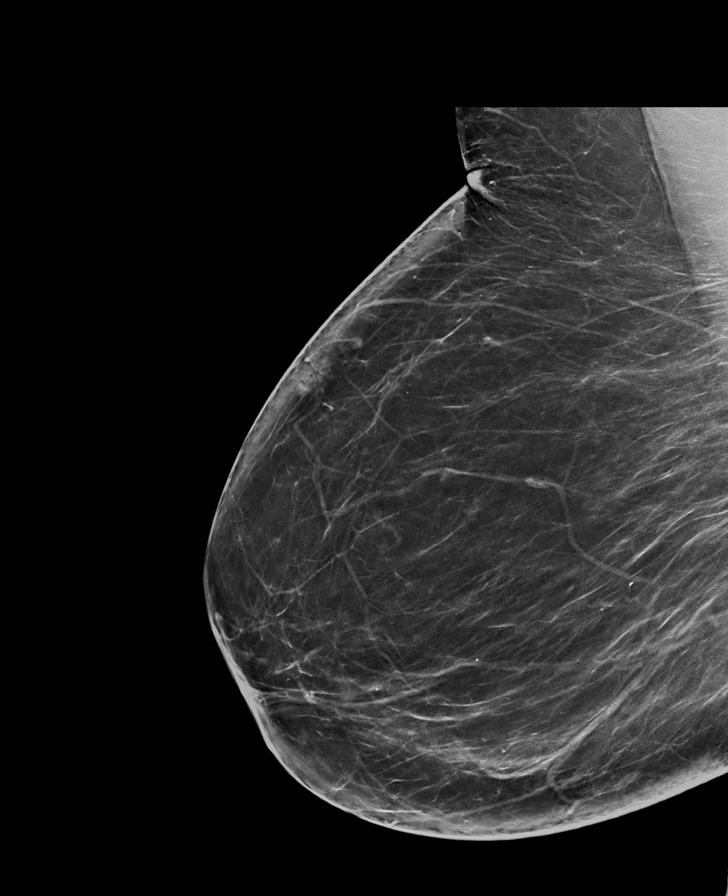

[L MLO synth-2D]
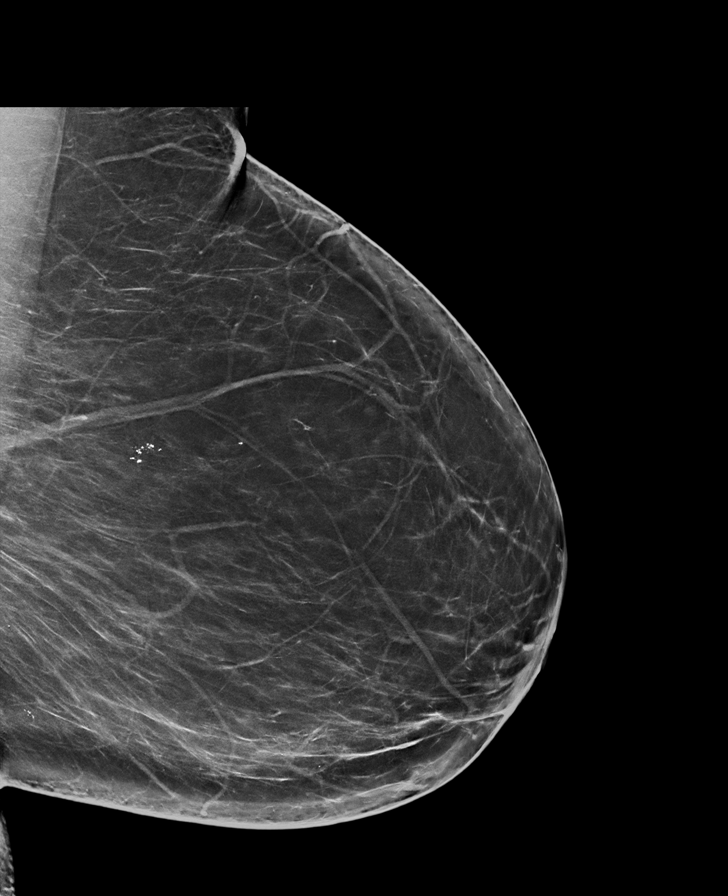

[L MLO tomo · tomo slice 41/82.0]
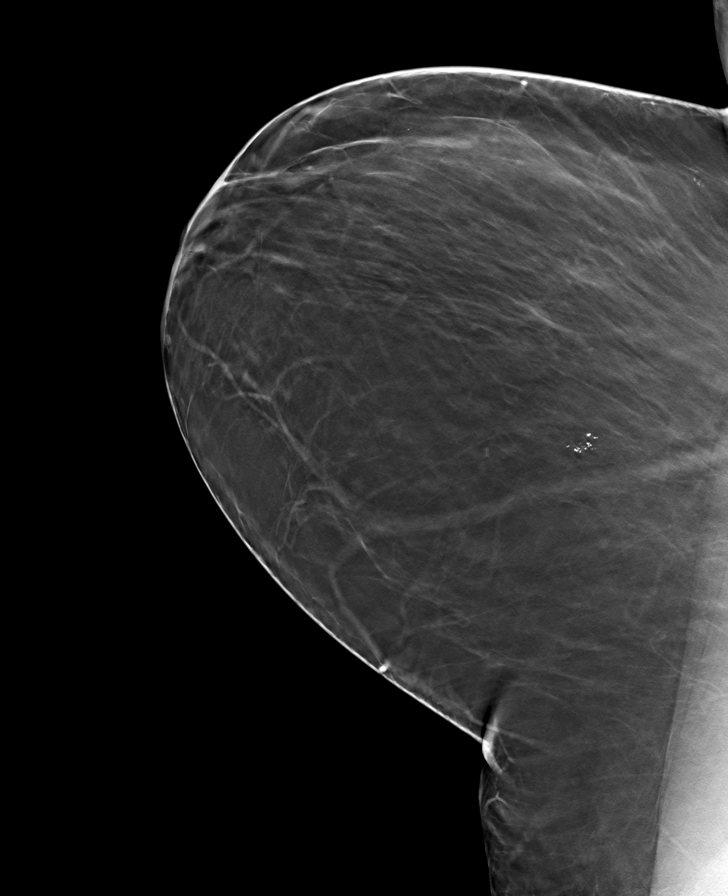

[R CC tomo · tomo slice 37/74.0]
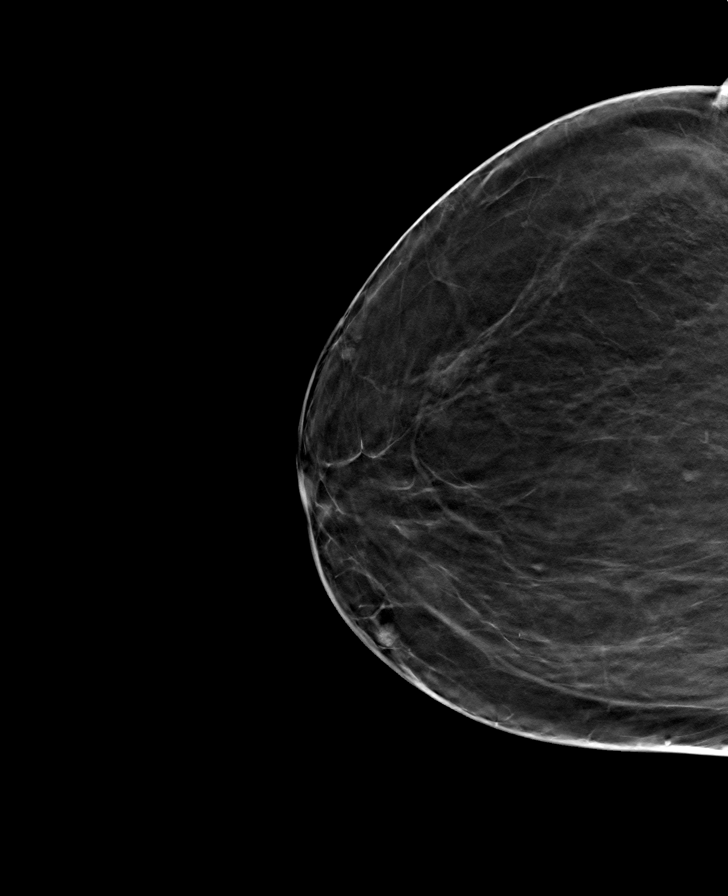

[R MLO tomo · tomo slice 41/80.0]
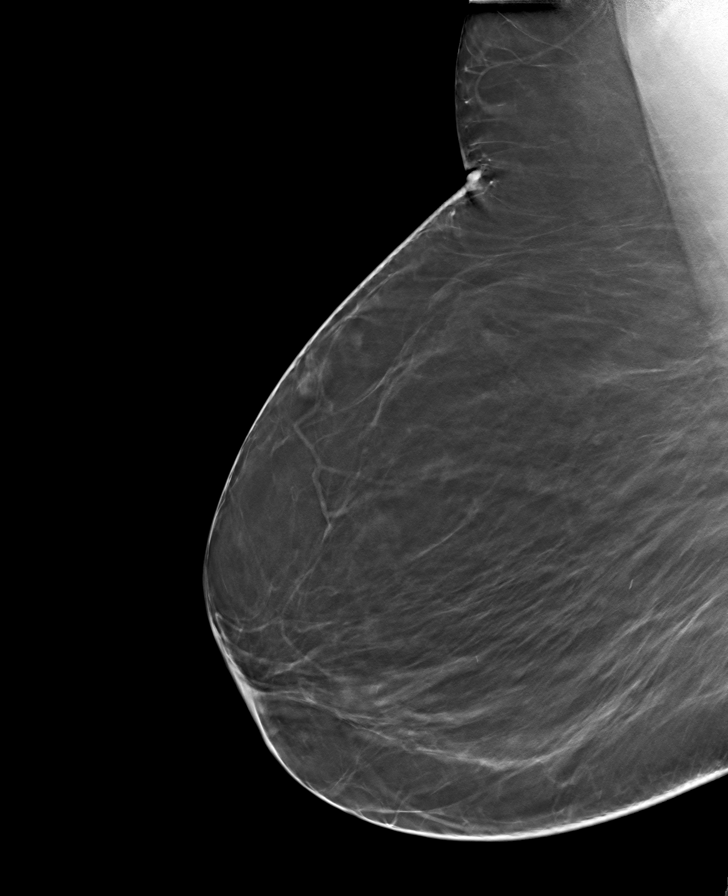

[L CC tomo · tomo slice 38/75.0]
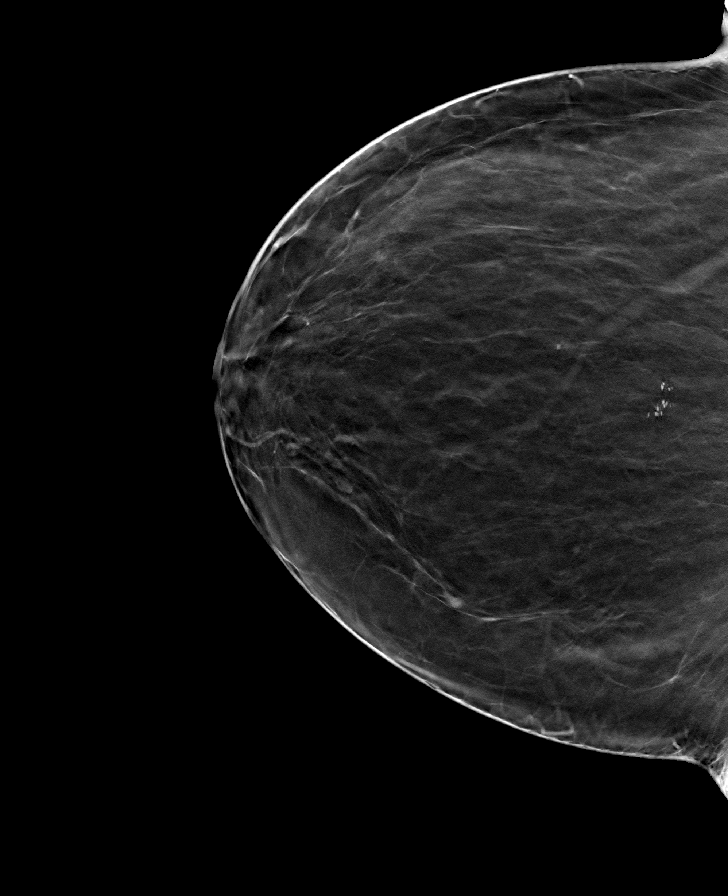

[8 of 24 positions shown; findings below may reference images not displayed]

ACR Breast Density Category b: There are scattered areas of
fibroglandular density.
FINDINGS: There are no findings suspicious for malignancy. Images were
processed with CAD.
IMPRESSION: No mammographic evidence of malignancy. A result letter of this
screening mammogram will be mailed directly to the patient.

RECOMMENDATION:
Screening mammogram in one year. (Code:[id number])

BI-RADS CATEGORY  1: Negative.

## 2020-04-18 ENCOUNTER — Other Ambulatory Visit: Payer: Self-pay | Admitting: Internal Medicine

## 2020-04-18 DIAGNOSIS — Z1231 Encounter for screening mammogram for malignant neoplasm of breast: Secondary | ICD-10-CM

## 2020-05-18 ENCOUNTER — Other Ambulatory Visit: Payer: Self-pay

## 2020-05-18 ENCOUNTER — Ambulatory Visit
Admission: RE | Admit: 2020-05-18 | Discharge: 2020-05-18 | Disposition: A | Discharge status: Home / Self Care | Payer: Medicare Other | Source: Ambulatory Visit | Attending: Internal Medicine | Admitting: Internal Medicine

## 2020-05-18 DIAGNOSIS — Z1231 Encounter for screening mammogram for malignant neoplasm of breast: Secondary | ICD-10-CM | POA: Insufficient documentation

## 2021-02-16 ENCOUNTER — Other Ambulatory Visit: Payer: Self-pay | Admitting: Internal Medicine

## 2021-02-16 DIAGNOSIS — Z1231 Encounter for screening mammogram for malignant neoplasm of breast: Secondary | ICD-10-CM

## 2021-02-20 ENCOUNTER — Other Ambulatory Visit: Payer: Self-pay | Admitting: Internal Medicine

## 2021-02-20 DIAGNOSIS — Z1231 Encounter for screening mammogram for malignant neoplasm of breast: Secondary | ICD-10-CM
